# Patient Record
Sex: Female | Born: 1965 | Race: White | Hispanic: No | Marital: Married | Smoking: Former smoker
Health system: Southern US, Community
[De-identification: ages and names within clinical notes are randomized; demographics above are authoritative.]

## PROBLEM LIST (undated history)

## (undated) DIAGNOSIS — E039 Hypothyroidism, unspecified: Secondary | ICD-10-CM

## (undated) DIAGNOSIS — F319 Bipolar disorder, unspecified: Secondary | ICD-10-CM

## (undated) DIAGNOSIS — G4733 Obstructive sleep apnea (adult) (pediatric): Secondary | ICD-10-CM

## (undated) DIAGNOSIS — S82409A Unspecified fracture of shaft of unspecified fibula, initial encounter for closed fracture: Secondary | ICD-10-CM

## (undated) DIAGNOSIS — I10 Essential (primary) hypertension: Secondary | ICD-10-CM

## (undated) DIAGNOSIS — E669 Obesity, unspecified: Secondary | ICD-10-CM

## (undated) DIAGNOSIS — G4700 Insomnia, unspecified: Secondary | ICD-10-CM

## (undated) DIAGNOSIS — G2581 Restless legs syndrome: Secondary | ICD-10-CM

## (undated) DIAGNOSIS — R110 Nausea: Secondary | ICD-10-CM

## (undated) DIAGNOSIS — K219 Gastro-esophageal reflux disease without esophagitis: Secondary | ICD-10-CM

## (undated) DIAGNOSIS — R42 Dizziness and giddiness: Secondary | ICD-10-CM

## (undated) HISTORY — DX: Obstructive sleep apnea (adult) (pediatric): G47.33

## (undated) HISTORY — DX: Gastro-esophageal reflux disease without esophagitis: K21.9

## (undated) HISTORY — DX: Essential (primary) hypertension: I10

## (undated) HISTORY — DX: Unspecified fracture of shaft of unspecified fibula, initial encounter for closed fracture: S82.409A

## (undated) HISTORY — PX: ABDOMINAL HYSTERECTOMY: SHX81

## (undated) HISTORY — DX: Restless legs syndrome: G25.81

## (undated) HISTORY — DX: Insomnia, unspecified: G47.00

## (undated) HISTORY — DX: Nausea: R11.0

## (undated) HISTORY — DX: Hypothyroidism, unspecified: E03.9

## (undated) HISTORY — DX: Obesity, unspecified: E66.9

## (undated) HISTORY — DX: Bipolar disorder, unspecified: F31.9

## (undated) HISTORY — DX: Dizziness and giddiness: R42

---

## 1998-11-29 ENCOUNTER — Ambulatory Visit: Admission: RE | Admit: 1998-11-29 | Discharge: 1998-11-29 | Payer: Self-pay | Admitting: Internal Medicine

## 1999-08-05 ENCOUNTER — Other Ambulatory Visit: Admission: RE | Admit: 1999-08-05 | Discharge: 1999-08-05 | Payer: Self-pay | Admitting: Obstetrics and Gynecology

## 2001-01-28 ENCOUNTER — Ambulatory Visit (HOSPITAL_COMMUNITY): Admission: RE | Admit: 2001-01-28 | Discharge: 2001-01-28 | Payer: Self-pay | Admitting: Obstetrics and Gynecology

## 2001-10-19 ENCOUNTER — Other Ambulatory Visit: Admission: RE | Admit: 2001-10-19 | Discharge: 2001-10-19 | Payer: Self-pay | Admitting: Obstetrics and Gynecology

## 2002-08-19 ENCOUNTER — Encounter: Payer: Self-pay | Admitting: Family Medicine

## 2002-08-19 ENCOUNTER — Encounter: Admission: RE | Admit: 2002-08-19 | Discharge: 2002-08-19 | Payer: Self-pay | Admitting: Family Medicine

## 2002-09-19 ENCOUNTER — Encounter: Admission: RE | Admit: 2002-09-19 | Discharge: 2002-09-19 | Payer: Self-pay | Admitting: Family Medicine

## 2002-09-19 ENCOUNTER — Encounter: Payer: Self-pay | Admitting: Family Medicine

## 2002-11-29 ENCOUNTER — Other Ambulatory Visit: Admission: RE | Admit: 2002-11-29 | Discharge: 2002-11-29 | Payer: Self-pay | Admitting: Obstetrics and Gynecology

## 2003-08-14 ENCOUNTER — Ambulatory Visit (HOSPITAL_COMMUNITY): Admission: RE | Admit: 2003-08-14 | Discharge: 2003-08-14 | Payer: Self-pay | Admitting: Obstetrics and Gynecology

## 2003-12-07 ENCOUNTER — Inpatient Hospital Stay (HOSPITAL_COMMUNITY): Admission: RE | Admit: 2003-12-07 | Discharge: 2003-12-09 | Payer: Self-pay | Admitting: Obstetrics and Gynecology

## 2003-12-20 ENCOUNTER — Inpatient Hospital Stay (HOSPITAL_COMMUNITY): Admission: AD | Admit: 2003-12-20 | Discharge: 2003-12-22 | Payer: Self-pay | Admitting: Obstetrics and Gynecology

## 2004-11-27 ENCOUNTER — Other Ambulatory Visit: Admission: RE | Admit: 2004-11-27 | Discharge: 2004-11-27 | Payer: Self-pay | Admitting: Obstetrics and Gynecology

## 2006-01-30 ENCOUNTER — Other Ambulatory Visit: Admission: RE | Admit: 2006-01-30 | Discharge: 2006-01-30 | Payer: Self-pay | Admitting: Obstetrics and Gynecology

## 2011-10-01 ENCOUNTER — Other Ambulatory Visit (HOSPITAL_COMMUNITY): Payer: Self-pay | Admitting: Family Medicine

## 2011-10-01 DIAGNOSIS — Z1231 Encounter for screening mammogram for malignant neoplasm of breast: Secondary | ICD-10-CM

## 2011-10-31 ENCOUNTER — Ambulatory Visit (HOSPITAL_COMMUNITY)
Admission: RE | Admit: 2011-10-31 | Discharge: 2011-10-31 | Disposition: A | Discharge status: Home / Self Care | Payer: Self-pay | Source: Ambulatory Visit | Attending: Family Medicine | Admitting: Family Medicine

## 2011-10-31 DIAGNOSIS — Z1231 Encounter for screening mammogram for malignant neoplasm of breast: Secondary | ICD-10-CM

## 2013-01-20 ENCOUNTER — Other Ambulatory Visit (HOSPITAL_COMMUNITY): Payer: Self-pay | Admitting: Nurse Practitioner

## 2013-01-31 ENCOUNTER — Ambulatory Visit (HOSPITAL_COMMUNITY)
Admission: RE | Admit: 2013-01-31 | Discharge: 2013-01-31 | Disposition: A | Discharge status: Home / Self Care | Payer: Self-pay | Source: Ambulatory Visit | Attending: Nurse Practitioner | Admitting: Nurse Practitioner

## 2013-01-31 DIAGNOSIS — Z1231 Encounter for screening mammogram for malignant neoplasm of breast: Secondary | ICD-10-CM

## 2014-06-12 ENCOUNTER — Encounter: Payer: Self-pay | Admitting: Internal Medicine

## 2014-07-19 ENCOUNTER — Telehealth: Payer: Self-pay | Admitting: Internal Medicine

## 2014-07-19 NOTE — Telephone Encounter (Signed)
Not sure if she had xrays or any pertinent testing we might need to acquire prior to upcoming appointment w/Dr Juanda Chance on 08-15-14.

## 2014-08-15 ENCOUNTER — Ambulatory Visit: Payer: Self-pay | Admitting: Internal Medicine

## 2014-10-10 ENCOUNTER — Ambulatory Visit: Payer: Self-pay | Admitting: Internal Medicine

## 2014-12-05 ENCOUNTER — Ambulatory Visit: Payer: Self-pay | Admitting: Internal Medicine

## 2017-09-23 ENCOUNTER — Other Ambulatory Visit: Payer: Self-pay | Admitting: Gastroenterology

## 2017-09-23 DIAGNOSIS — R110 Nausea: Secondary | ICD-10-CM

## 2017-09-29 ENCOUNTER — Encounter (HOSPITAL_COMMUNITY): Payer: Self-pay | Admitting: Emergency Medicine

## 2017-09-29 ENCOUNTER — Other Ambulatory Visit: Payer: Self-pay

## 2017-09-29 ENCOUNTER — Ambulatory Visit (HOSPITAL_COMMUNITY)
Admission: EM | Admit: 2017-09-29 | Discharge: 2017-10-01 | Disposition: A | Discharge status: Home / Self Care | Payer: No Typology Code available for payment source | Attending: General Surgery | Admitting: General Surgery

## 2017-09-29 ENCOUNTER — Ambulatory Visit
Admission: RE | Admit: 2017-09-29 | Discharge: 2017-09-29 | Disposition: A | Discharge status: Home / Self Care | Payer: 59 | Source: Ambulatory Visit | Attending: Gastroenterology | Admitting: Gastroenterology

## 2017-09-29 DIAGNOSIS — I10 Essential (primary) hypertension: Secondary | ICD-10-CM | POA: Diagnosis not present

## 2017-09-29 DIAGNOSIS — E039 Hypothyroidism, unspecified: Secondary | ICD-10-CM | POA: Diagnosis not present

## 2017-09-29 DIAGNOSIS — Z419 Encounter for procedure for purposes other than remedying health state, unspecified: Secondary | ICD-10-CM

## 2017-09-29 DIAGNOSIS — K8010 Calculus of gallbladder with chronic cholecystitis without obstruction: Secondary | ICD-10-CM | POA: Diagnosis not present

## 2017-09-29 DIAGNOSIS — R110 Nausea: Secondary | ICD-10-CM

## 2017-09-29 DIAGNOSIS — K219 Gastro-esophageal reflux disease without esophagitis: Secondary | ICD-10-CM | POA: Diagnosis not present

## 2017-09-29 DIAGNOSIS — K812 Acute cholecystitis with chronic cholecystitis: Secondary | ICD-10-CM | POA: Diagnosis present

## 2017-09-29 DIAGNOSIS — K810 Acute cholecystitis: Secondary | ICD-10-CM | POA: Diagnosis present

## 2017-09-29 DIAGNOSIS — E669 Obesity, unspecified: Secondary | ICD-10-CM | POA: Diagnosis not present

## 2017-09-29 LAB — COMPREHENSIVE METABOLIC PANEL
ALBUMIN: 3.7 g/dL (ref 3.5–5.0)
ALT: 19 U/L (ref 14–54)
AST: 22 U/L (ref 15–41)
Alkaline Phosphatase: 61 U/L (ref 38–126)
Anion gap: 6 (ref 5–15)
BUN: 12 mg/dL (ref 6–20)
CALCIUM: 9.9 mg/dL (ref 8.9–10.3)
CO2: 29 mmol/L (ref 22–32)
Chloride: 104 mmol/L (ref 101–111)
Creatinine, Ser: 1 mg/dL (ref 0.44–1.00)
GFR calc Af Amer: >60 mL/min (ref >60)
GFR calc non Af Amer: >60 mL/min (ref >60)
Glucose, Bld: 98 mg/dL (ref 65–99)
POTASSIUM: 4.2 mmol/L (ref 3.5–5.1)
SODIUM: 139 mmol/L (ref 135–145)
TOTAL PROTEIN: 7.3 g/dL (ref 6.5–8.1)
Total Bilirubin: 0.8 mg/dL (ref 0.3–1.2)

## 2017-09-29 LAB — URINALYSIS, ROUTINE W REFLEX MICROSCOPIC
BILIRUBIN URINE: NEGATIVE
Glucose, UA: NEGATIVE mg/dL
Hgb urine dipstick: NEGATIVE
Ketones, ur: NEGATIVE mg/dL
LEUKOCYTES UA: NEGATIVE
NITRITE: NEGATIVE
Protein, ur: NEGATIVE mg/dL
SPECIFIC GRAVITY, URINE: 1.017 (ref 1.005–1.030)
pH: 6 (ref 5.0–8.0)

## 2017-09-29 LAB — CBC
HEMATOCRIT: 40.3 % (ref 36.0–46.0)
HEMOGLOBIN: 13.6 g/dL (ref 12.0–15.0)
MCH: 28.8 pg (ref 26.0–34.0)
MCHC: 33.7 g/dL (ref 30.0–36.0)
MCV: 85.4 fL (ref 78.0–100.0)
Platelets: 244 10*3/uL (ref 150–400)
RBC: 4.72 MIL/uL (ref 3.87–5.11)
RDW: 14.2 % (ref 11.5–15.5)
WBC: 7.8 10*3/uL (ref 4.0–10.5)

## 2017-09-29 LAB — LIPASE, BLOOD: Lipase: 34 U/L (ref 11–51)

## 2017-09-29 MED ORDER — ONDANSETRON HCL 4 MG/2ML IJ SOLN
4.0000 mg | Freq: Once | INTRAMUSCULAR | Status: AC
  Administered 2017-09-29: 4 mg via INTRAVENOUS
  Filled 2017-09-29: qty 2

## 2017-09-29 MED ORDER — HYDROCHLOROTHIAZIDE 12.5 MG PO CAPS: 12.5000 mg | ORAL_CAPSULE | Freq: Every day | ORAL | Status: DC

## 2017-09-29 MED ORDER — OXYCODONE HCL 5 MG PO TABS: 5.0000 mg | ORAL_TABLET | ORAL | Status: DC | PRN

## 2017-09-29 MED ORDER — LEVOTHYROXINE SODIUM 125 MCG PO TABS
125.0000 ug | ORAL_TABLET | Freq: Every day | ORAL | Status: DC
  Administered 2017-09-30: 125 ug via ORAL
  Filled 2017-09-29: qty 1

## 2017-09-29 MED ORDER — ONDANSETRON 4 MG PO TBDP: 4.0000 mg | ORAL_TABLET | Freq: Four times a day (QID) | ORAL | Status: DC | PRN

## 2017-09-29 MED ORDER — METHOCARBAMOL 500 MG PO TABS: 500.0000 mg | ORAL_TABLET | Freq: Four times a day (QID) | ORAL | Status: DC | PRN

## 2017-09-29 MED ORDER — ZOLPIDEM TARTRATE 5 MG PO TABS
5.0000 mg | ORAL_TABLET | Freq: Every evening | ORAL | Status: DC | PRN
  Administered 2017-09-29: 5 mg via ORAL
  Filled 2017-09-29: qty 1

## 2017-09-29 MED ORDER — DIPHENHYDRAMINE HCL 12.5 MG/5ML PO ELIX: 12.5000 mg | ORAL_SOLUTION | Freq: Four times a day (QID) | ORAL | Status: DC | PRN

## 2017-09-29 MED ORDER — DIPHENHYDRAMINE HCL 50 MG/ML IJ SOLN: 12.5000 mg | Freq: Four times a day (QID) | INTRAMUSCULAR | Status: DC | PRN

## 2017-09-29 MED ORDER — ONDANSETRON HCL 4 MG/2ML IJ SOLN
4.0000 mg | Freq: Four times a day (QID) | INTRAMUSCULAR | Status: DC | PRN
  Administered 2017-09-30: 4 mg via INTRAVENOUS
  Filled 2017-09-29: qty 2

## 2017-09-29 MED ORDER — HYDRALAZINE HCL 20 MG/ML IJ SOLN: 10.0000 mg | INTRAMUSCULAR | Status: DC | PRN

## 2017-09-29 MED ORDER — CEFTRIAXONE SODIUM 2 G IJ SOLR
2.0000 g | INTRAMUSCULAR | Status: DC
  Administered 2017-09-30: 3 g via INTRAVENOUS
  Filled 2017-09-29: qty 2

## 2017-09-29 MED ORDER — DEXTROSE 5 % IV SOLN
2.0000 g | Freq: Once | INTRAVENOUS | Status: AC
  Administered 2017-09-29: 2 g via INTRAVENOUS
  Filled 2017-09-29: qty 2

## 2017-09-29 MED ORDER — LORATADINE 10 MG PO TABS: 10.0000 mg | ORAL_TABLET | Freq: Every day | ORAL | Status: DC

## 2017-09-29 MED ORDER — ENOXAPARIN SODIUM 40 MG/0.4ML ~~LOC~~ SOLN: 40.0000 mg | SUBCUTANEOUS | Status: DC

## 2017-09-29 MED ORDER — BENAZEPRIL HCL 20 MG PO TABS
20.0000 mg | ORAL_TABLET | Freq: Every day | ORAL | Status: DC
  Administered 2017-09-29: 20 mg via ORAL
  Filled 2017-09-29 (×2): qty 1

## 2017-09-29 MED ORDER — ACETAMINOPHEN 325 MG PO TABS: 650.0000 mg | ORAL_TABLET | Freq: Four times a day (QID) | ORAL | Status: DC | PRN

## 2017-09-29 MED ORDER — HYDROMORPHONE HCL 1 MG/ML IJ SOLN: 1.0000 mg | INTRAMUSCULAR | Status: DC | PRN

## 2017-09-29 MED ORDER — KCL IN DEXTROSE-NACL 20-5-0.9 MEQ/L-%-% IV SOLN
INTRAVENOUS | Status: DC
  Administered 2017-09-29: 22:00:00 via INTRAVENOUS
  Filled 2017-09-29 (×2): qty 1000

## 2017-09-29 MED ORDER — ACETAMINOPHEN 650 MG RE SUPP: 650.0000 mg | Freq: Four times a day (QID) | RECTAL | Status: DC | PRN

## 2017-09-29 NOTE — ED Notes (Signed)
Attempted report x1. 

## 2017-09-29 NOTE — ED Triage Notes (Signed)
Pt to ER sent from PCP for cholecystectomy. States not much pain but significant nausea. Pt reports having [place] completed this morning and was called to come to ER. Pt a/o x4. In NAD.

## 2017-09-29 NOTE — H&P (Signed)
Wanda Pratt is an 51 y.o. female.   Chief Complaint: Abdominal pain and nausea for 3 months HPI: Patient presents emergency room with a 58-monthhistory of intermittent nausea.  The nausea is been severe and is made worse with eating.  She is now developed right upper quadrant pain.  Ultrasound was done which shows multiple gallstones and mildly thickened gallbladder wall.  She has right upper quadrant pain especially with palpation.  She has not had this pain before but the nausea and symptoms have been persistent for at least 3 years.  Past Medical History:  Diagnosis Date  . GERD (gastroesophageal reflux disease)   . Hypertension   . Hypothyroidism   . Obesity     History reviewed. No pertinent surgical history.  History reviewed. No pertinent family history. Social History:  reports that  has never smoked. she has never used smokeless tobacco. She reports that she does not drink alcohol or use drugs.  Allergies: Not on File   (Not in a hospital admission)  Results for orders placed or performed during the hospital encounter of 09/29/17 (from the past 48 hour(s))  Lipase, blood     Status: None   Collection Time: 09/29/17  1:12 PM  Result Value Ref Range   Lipase 34 11 - 51 U/L  Comprehensive metabolic panel     Status: None   Collection Time: 09/29/17  1:12 PM  Result Value Ref Range   Sodium 139 135 - 145 mmol/L   Potassium 4.2 3.5 - 5.1 mmol/L   Chloride 104 101 - 111 mmol/L   CO2 29 22 - 32 mmol/L   Glucose, Bld 98 65 - 99 mg/dL   BUN 12 6 - 20 mg/dL   Creatinine, Ser 1.00 0.44 - 1.00 mg/dL   Calcium 9.9 8.9 - 10.3 mg/dL   Total Protein 7.3 6.5 - 8.1 g/dL   Albumin 3.7 3.5 - 5.0 g/dL   AST 22 15 - 41 U/L   ALT 19 14 - 54 U/L   Alkaline Phosphatase 61 38 - 126 U/L   Total Bilirubin 0.8 0.3 - 1.2 mg/dL   GFR calc non Af Amer >60 >60 mL/min   GFR calc Af Amer >60 >60 mL/min    Comment: (NOTE) The eGFR has been calculated using the CKD EPI equation. This  calculation has not been validated in all clinical situations. eGFR's persistently <60 mL/min signify possible Chronic Kidney Disease.    Anion gap 6 5 - 15  CBC     Status: None   Collection Time: 09/29/17  1:12 PM  Result Value Ref Range   WBC 7.8 4.0 - 10.5 K/uL   RBC 4.72 3.87 - 5.11 MIL/uL   Hemoglobin 13.6 12.0 - 15.0 g/dL   HCT 40.3 36.0 - 46.0 %   MCV 85.4 78.0 - 100.0 fL   MCH 28.8 26.0 - 34.0 pg   MCHC 33.7 30.0 - 36.0 g/dL   RDW 14.2 11.5 - 15.5 %   Platelets 244 150 - 400 K/uL  Urinalysis, Routine w reflex microscopic     Status: None   Collection Time: 09/29/17  1:27 PM  Result Value Ref Range   Color, Urine YELLOW YELLOW   APPearance CLEAR CLEAR   Specific Gravity, Urine 1.017 1.005 - 1.030   pH 6.0 5.0 - 8.0   Glucose, UA NEGATIVE NEGATIVE mg/dL   Hgb urine dipstick NEGATIVE NEGATIVE   Bilirubin Urine NEGATIVE NEGATIVE   Ketones, ur NEGATIVE NEGATIVE mg/dL  Protein, ur NEGATIVE NEGATIVE mg/dL   Nitrite NEGATIVE NEGATIVE   Leukocytes, UA NEGATIVE NEGATIVE   US Abdomen Complete  Result Date: 09/29/2017 CLINICAL DATA:  Nausea, history of gastroesophageal reflux. EXAM: ABDOMEN ULTRASOUND COMPLETE COMPARISON:  None in PACs FINDINGS: Gallbladder: The gallbladder is filled with stones. The largest measures 1.1 cm. There is mild gallbladder wall thickening to 3.8 mm. There is a positive sonographic Murphy's sign. Common bile duct: Diameter: 5 mm Liver: Penetration of the liver by the ultrasound beam was limited. The echotexture appears increased. There is no discrete mass or ductal dilation. The surface contour appears smooth. Portal vein is patent on color Doppler imaging with normal direction of blood flow towards the liver. IVC: No abnormality visualized. Pancreas: Visualized portion unremarkable. Spleen: 10.3 cm in length with normal echotexture Right Kidney: Length: 11.7 cm. Echogenicity within normal limits. No mass or hydronephrosis visualized. Left Kidney: Length:  13.4 cm. Echogenicity within normal limits. No mass or hydronephrosis visualized. Abdominal aorta: Visualization is limited. No aneurysm was observed. Other findings: No ascites is demonstrated. IMPRESSION: Gallstones with sonographic evidence of acute cholecystitis. Increased hepatic echotexture which limits evaluation of the liver. No discrete hepatic mass or ductal dilation. No acute abnormality observed elsewhere within the abdomen. These results will be called to the ordering clinician or representative by the Radiologist Assistant, and communication documented in the PACS or zVision Dashboard. Electronically Signed   By: David  Martin[place] M.D.   On: 09/29/2017 09:51    Review of Systems  Constitutional: Negative for chills and fever.  HENT: Negative for hearing loss and tinnitus.   Eyes: Negative for blurred vision and double vision.  Respiratory: Negative for cough and hemoptysis.   Cardiovascular: Negative for chest pain and palpitations.  Gastrointestinal: Positive for abdominal pain, nausea and vomiting.  Genitourinary: Negative for dysuria.  Musculoskeletal: Negative for myalgias.  Skin: Negative for rash.  Neurological: Negative for dizziness.  Endo/Heme/Allergies: Does not bruise/bleed easily.  Psychiatric/Behavioral: Negative for depression.    Blood pressure 117/81, pulse 67, temperature 98.9 F (37.2 C), temperature source Oral, resp. rate 18, height 5' 5.5" (1.664 m), weight 132.5 kg (292 lb), SpO2 99 %. Physical Exam  Constitutional: She is oriented to person, place, and time. She appears well-developed and well-nourished.  HENT:  Head: Normocephalic.  Eyes: Pupils are equal, round, and reactive to light.  Neck: Normal range of motion.  Cardiovascular: Normal rate and regular rhythm.  Respiratory: Effort normal and breath sounds normal.  GI: Soft. There is tenderness. There is positive Murphy's sign.  Musculoskeletal: Normal range of motion.  Neurological: She is alert and  oriented to person, place, and time.  Skin: Skin is warm and dry.     Assessment/Plan Cholelithiasis with early acute cholecystitis  Admit for IV fluids, IV antibiotics.  Will benefit from cholecystectomy and will try to schedule for tomorrow morning.  Dr. Dalbert Batman will see to discuss further in the morning.  The patient's questions were answered along with her husband.  Turner Daniels, MD 09/29/2017, 5:34 PM

## 2017-09-29 NOTE — ED Notes (Signed)
Pt recently ate breakfast at 9 am.

## 2017-09-29 NOTE — ED Notes (Signed)
Pt given ginger ale, tolerating well. Family at bedside.

## 2017-09-29 NOTE — ED Notes (Signed)
Pt provided with chicken broth 

## 2017-09-29 NOTE — ED Provider Notes (Signed)
[facility] EMERGENCY DEPARTMENT Provider Note   CSN: [id number] Arrival date & time: 09/29/17  1204     History   Chief Complaint Chief Complaint  Patient presents with  . Abdominal Pain    HPI Wanda Pratt is a 51 y.o. female who presents with nausea and abdominal pain. PMH significant of GERD, HTN, hypothyroidism. She is s/p hysterectomy and has had C-sections. She states that she has had nausea and intermittent abdominal pain for the past 2 years. She states she didn't have insurance so never got this checked. She recently obtained insurance so went to [facility] and had an US done. The US shows multiple gallstones and evidence of cholecystitis. The patient reports chills and constant nausea which she takes Phenergan and Zofran for. She also has abdominal pain but this is not as bad as the nausea. The pain is sometimes worse after eating. She has been NPO since 9AM this morning. No fever, chest pain, SOB, diarrhea/constipation, urinary symptoms.  HPI  Past Medical History:  Diagnosis Date  . GERD (gastroesophageal reflux disease)   . Hypertension   . Hypothyroidism   . Obesity     There are no active problems to display for this patient.   History reviewed. No pertinent surgical history.  OB History    No data available       Home Medications    Prior to Admission medications   Medication Sig Start Date End Date Taking? Authorizing Provider  benazepril (LOTENSIN) 20 MG tablet Take 20 mg by mouth daily.    [provider]  hydrochlorothiazide (MICROZIDE) 12.5 MG capsule Take 12.5 mg by mouth daily.    [provider]  levothyroxine (SYNTHROID, LEVOTHROID) 125 MCG tablet Take 125 mcg by mouth daily before breakfast.    [provider]  loratadine (CLARITIN) 10 MG tablet Take 10 mg by mouth daily.    [provider]  ondansetron (ZOFRAN) 4 MG tablet Take 4 mg by mouth 2 (two) times daily.    [provider]  ranitidine (ZANTAC) 150 MG capsule Take 150 mg by mouth 2 (two) times daily.    [provider]    Family History History reviewed. No pertinent family history.  Social History Social History   Tobacco Use  . Smoking status: Never Smoker  . Smokeless tobacco: Never Used  Substance Use Topics  . Alcohol use: No    Frequency: Never  . Drug use: No     Allergies   Patient has no allergy information on record.   Review of Systems Review of Systems  Constitutional: Positive for chills. Negative for appetite change and fever.  Respiratory: Negative for shortness of breath.   Cardiovascular: Negative for chest pain.  Gastrointestinal: Positive for abdominal pain and nausea. Negative for constipation, diarrhea and vomiting.  Genitourinary: Negative for dysuria, frequency and pelvic pain.  All other systems reviewed and are negative.    Physical Exam Updated Vital Signs BP 117/81 (BP Location: Left Arm)   Pulse 67   Temp 98.9 F (37.2 C) (Oral)   Resp 18   Ht 5' 5.5" (1.664 m)   Wt 132.5 kg (292 lb)   SpO2 99%   BMI 47.85 kg/m   Physical Exam  Constitutional: She is oriented to person, place, and time. She appears well-developed and well-nourished. No distress.  Obese, calm, pleasant  HENT:  Head: Normocephalic and atraumatic.  Eyes: Conjunctivae are normal. Pupils are equal, round, and reactive to  light. Right eye exhibits no discharge. Left eye exhibits no discharge. No scleral icterus.  Neck: Normal range of motion.  Cardiovascular: Normal rate and regular rhythm. Exam reveals no gallop and no friction rub.  No murmur heard. Pulmonary/Chest: Effort normal and breath sounds normal. No stridor. No respiratory distress. She has no wheezes. She has no rales. She exhibits no tenderness.  Abdominal: Soft. Bowel sounds are normal. She exhibits no distension and no mass. There is tenderness (Significant RUQ tenderness. Mild RLQ tenderness). There  is no rebound and no guarding. No hernia.  Exam difficult due to body habitus  Neurological: She is alert and oriented to person, place, and time.  Skin: Skin is warm and dry.  Psychiatric: She has a normal mood and affect. Her behavior is normal.  Nursing note and vitals reviewed.    ED Treatments / Results  Labs (all labs ordered are listed, but only abnormal results are displayed) Labs Reviewed  LIPASE, BLOOD  COMPREHENSIVE METABOLIC PANEL  CBC  URINALYSIS, ROUTINE W REFLEX MICROSCOPIC    EKG  EKG Interpretation None       Radiology [place] Abdomen Complete  Result Date: 09/29/2017 CLINICAL DATA:  Nausea, history of gastroesophageal reflux. EXAM: ABDOMEN ULTRASOUND COMPLETE COMPARISON:  None in PACs FINDINGS: Gallbladder: The gallbladder is filled with stones. The largest measures 1.1 cm. There is mild gallbladder wall thickening to 3.8 mm. There is a positive sonographic Murphy's sign. Common bile duct: Diameter: 5 mm Liver: Penetration of the liver by the ultrasound beam was limited. The echotexture appears increased. There is no discrete mass or ductal dilation. The surface contour appears smooth. Portal vein is patent on color Doppler imaging with normal direction of blood flow towards the liver. IVC: No abnormality visualized. Pancreas: Visualized portion unremarkable. Spleen: 10.3 cm in length with normal echotexture Right Kidney: Length: 11.7 cm. Echogenicity within normal limits. No mass or hydronephrosis visualized. Left Kidney: Length: 13.4 cm. Echogenicity within normal limits. No mass or hydronephrosis visualized. Abdominal aorta: Visualization is limited. No aneurysm was observed. Other findings: No ascites is demonstrated. IMPRESSION: Gallstones with sonographic evidence of acute cholecystitis. Increased hepatic echotexture which limits evaluation of the liver. No discrete hepatic mass or ductal dilation. No acute abnormality observed elsewhere within the abdomen. These  results will be called to the ordering clinician or representative by the Radiologist Assistant, and communication documented in the PACS or zVision Dashboard. Electronically Signed   By: David  [place] M.D.   On: 09/29/2017 09:51    Procedures Procedures (including critical care time)  Medications Ordered in ED Medications  cefTRIAXone (ROCEPHIN) 2 g in dextrose 5 % 50 mL IVPB (2 g Intravenous New Bag/Given 09/29/17 1735)  benazepril (LOTENSIN) tablet 20 mg (not administered)  hydrochlorothiazide (MICROZIDE) capsule 12.5 mg (not administered)  levothyroxine (SYNTHROID, LEVOTHROID) tablet 125 mcg (not administered)  loratadine (CLARITIN) tablet 10 mg (not administered)  ondansetron (ZOFRAN) injection 4 mg (4 mg Intravenous Given 09/29/17 1735)     Initial Impression / Assessment and Plan / ED Course  I have reviewed the triage vital signs and the nursing notes.  Pertinent labs & imaging results that were available during my care of the patient were reviewed by me and considered in my medical decision making (see chart for details).  51 year old female presents with abdominal pain and nausea and evidence of acute cholecystitis. Vitals are normal. She is well appearing. She is tender in RUQ on exam. Labs are normal. Ua is clean. US from earlier  today shows cholecystitis. Will start Rocephin and Zofran. She declines pain medicine. I spoke with Dr. Luisa Hart with surgery who will come to see patient.  Final Clinical Impressions(s) / ED Diagnoses   Final diagnoses:  Acute cholecystitis    ED Discharge Orders    None       Bethel Born, PA-C 09/29/17 2012    Nira Conn, MD 09/30/17 [phone]

## 2017-09-30 ENCOUNTER — Ambulatory Visit (HOSPITAL_COMMUNITY)
Admission: EM | Disposition: A | Discharge status: Home / Self Care | Payer: Self-pay | Source: Home / Self Care | Attending: Emergency Medicine

## 2017-09-30 ENCOUNTER — Inpatient Hospital Stay (HOSPITAL_COMMUNITY): Payer: No Typology Code available for payment source

## 2017-09-30 ENCOUNTER — Inpatient Hospital Stay (HOSPITAL_COMMUNITY): Payer: No Typology Code available for payment source | Admitting: Critical Care Medicine

## 2017-09-30 ENCOUNTER — Encounter (HOSPITAL_COMMUNITY): Payer: Self-pay | Admitting: Critical Care Medicine

## 2017-09-30 DIAGNOSIS — K812 Acute cholecystitis with chronic cholecystitis: Secondary | ICD-10-CM | POA: Diagnosis present

## 2017-09-30 HISTORY — PX: CHOLECYSTECTOMY: SHX55

## 2017-09-30 LAB — CBC
HCT: 40.5 % (ref 36.0–46.0)
HEMOGLOBIN: 13.3 g/dL (ref 12.0–15.0)
MCH: 28.6 pg (ref 26.0–34.0)
MCHC: 32.8 g/dL (ref 30.0–36.0)
MCV: 87.1 fL (ref 78.0–100.0)
PLATELETS: 236 10*3/uL (ref 150–400)
RBC: 4.65 MIL/uL (ref 3.87–5.11)
RDW: 14.6 % (ref 11.5–15.5)
WBC: 6.2 10*3/uL (ref 4.0–10.5)

## 2017-09-30 LAB — CREATININE, SERUM: CREATININE: 0.99 mg/dL (ref 0.44–1.00)

## 2017-09-30 LAB — HIV ANTIBODY (ROUTINE TESTING W REFLEX): HIV SCREEN 4TH GENERATION: NONREACTIVE

## 2017-09-30 LAB — SURGICAL PCR SCREEN
MRSA, PCR: NEGATIVE
STAPHYLOCOCCUS AUREUS: NEGATIVE

## 2017-09-30 SURGERY — LAPAROSCOPIC CHOLECYSTECTOMY WITH INTRAOPERATIVE CHOLANGIOGRAM
Anesthesia: General | Site: Abdomen

## 2017-09-30 MED ORDER — CEFAZOLIN SODIUM 1 G IJ SOLR
INTRAMUSCULAR | Status: AC
  Filled 2017-09-30: qty 30

## 2017-09-30 MED ORDER — HYDROCODONE-ACETAMINOPHEN 5-325 MG PO TABS
1.0000 | ORAL_TABLET | ORAL | Status: DC | PRN
  Administered 2017-09-30 – 2017-10-01 (×4): 2 via ORAL
  Filled 2017-09-30 (×4): qty 2

## 2017-09-30 MED ORDER — ENOXAPARIN SODIUM 40 MG/0.4ML ~~LOC~~ SOLN
40.0000 mg | SUBCUTANEOUS | Status: DC
  Administered 2017-10-01: 40 mg via SUBCUTANEOUS
  Filled 2017-09-30: qty 0.4

## 2017-09-30 MED ORDER — SUCCINYLCHOLINE CHLORIDE 200 MG/10ML IV SOSY
PREFILLED_SYRINGE | INTRAVENOUS | Status: DC | PRN
  Administered 2017-09-30: 100 mg via INTRAVENOUS

## 2017-09-30 MED ORDER — FENTANYL CITRATE (PF) 100 MCG/2ML IJ SOLN
25.0000 ug | INTRAMUSCULAR | Status: DC | PRN
  Administered 2017-09-30 (×2): 50 ug via INTRAVENOUS

## 2017-09-30 MED ORDER — ONDANSETRON HCL 4 MG/2ML IJ SOLN
INTRAMUSCULAR | Status: DC | PRN
  Administered 2017-09-30: 4 mg via INTRAVENOUS

## 2017-09-30 MED ORDER — LACTATED RINGERS IV SOLN
INTRAVENOUS | Status: DC
  Administered 2017-09-30 – 2017-10-01 (×2): via INTRAVENOUS

## 2017-09-30 MED ORDER — METHOCARBAMOL 500 MG PO TABS
ORAL_TABLET | ORAL | Status: AC
  Administered 2017-09-30: 500 mg via ORAL
  Filled 2017-09-30: qty 1

## 2017-09-30 MED ORDER — LIDOCAINE 2% (20 MG/ML) 5 ML SYRINGE
INTRAMUSCULAR | Status: AC
  Filled 2017-09-30: qty 5

## 2017-09-30 MED ORDER — DEXAMETHASONE SODIUM PHOSPHATE 10 MG/ML IJ SOLN
INTRAMUSCULAR | Status: DC | PRN
  Administered 2017-09-30: 10 mg via INTRAVENOUS

## 2017-09-30 MED ORDER — LIDOCAINE 2% (20 MG/ML) 5 ML SYRINGE
INTRAMUSCULAR | Status: DC | PRN
  Administered 2017-09-30: 100 mg via INTRAVENOUS

## 2017-09-30 MED ORDER — DEXAMETHASONE SODIUM PHOSPHATE 10 MG/ML IJ SOLN
INTRAMUSCULAR | Status: AC
  Filled 2017-09-30: qty 1

## 2017-09-30 MED ORDER — LACTATED RINGERS IV SOLN
INTRAVENOUS | Status: DC
  Administered 2017-09-30: 12:00:00 via INTRAVENOUS
  Administered 2017-09-30: 50 mL/h via INTRAVENOUS

## 2017-09-30 MED ORDER — FENTANYL CITRATE (PF) 100 MCG/2ML IJ SOLN
INTRAMUSCULAR | Status: AC
  Administered 2017-09-30: 50 ug via INTRAVENOUS
  Filled 2017-09-30: qty 2

## 2017-09-30 MED ORDER — MIDAZOLAM HCL 2 MG/2ML IJ SOLN
INTRAMUSCULAR | Status: AC
  Filled 2017-09-30: qty 2

## 2017-09-30 MED ORDER — BUPIVACAINE-EPINEPHRINE 0.5% -1:200000 IJ SOLN
INTRAMUSCULAR | Status: DC | PRN
  Administered 2017-09-30: 24 mL

## 2017-09-30 MED ORDER — FENTANYL CITRATE (PF) 250 MCG/5ML IJ SOLN
INTRAMUSCULAR | Status: AC
  Filled 2017-09-30: qty 5

## 2017-09-30 MED ORDER — FENTANYL CITRATE (PF) 250 MCG/5ML IJ SOLN
INTRAMUSCULAR | Status: DC | PRN
  Administered 2017-09-30: 50 ug via INTRAVENOUS
  Administered 2017-09-30 (×4): 25 ug via INTRAVENOUS
  Administered 2017-09-30 (×2): 50 ug via INTRAVENOUS
  Administered 2017-09-30: 100 ug via INTRAVENOUS
  Administered 2017-09-30: 25 ug via INTRAVENOUS

## 2017-09-30 MED ORDER — SUCCINYLCHOLINE CHLORIDE 200 MG/10ML IV SOSY
PREFILLED_SYRINGE | INTRAVENOUS | Status: AC
  Filled 2017-09-30: qty 10

## 2017-09-30 MED ORDER — ONDANSETRON HCL 4 MG/2ML IJ SOLN
INTRAMUSCULAR | Status: AC
  Filled 2017-09-30: qty 2

## 2017-09-30 MED ORDER — IOPAMIDOL (ISOVUE-300) INJECTION 61%
INTRAVENOUS | Status: AC
  Filled 2017-09-30: qty 50

## 2017-09-30 MED ORDER — PROPOFOL 10 MG/ML IV BOLUS
INTRAVENOUS | Status: AC
  Filled 2017-09-30: qty 20

## 2017-09-30 MED ORDER — THYROID 120 MG PO TABS
120.0000 mg | ORAL_TABLET | Freq: Every day | ORAL | Status: DC
  Administered 2017-10-01: 120 mg via ORAL
  Filled 2017-09-30: qty 1

## 2017-09-30 MED ORDER — BUPIVACAINE-EPINEPHRINE (PF) 0.5% -1:200000 IJ SOLN
INTRAMUSCULAR | Status: AC
  Filled 2017-09-30: qty 30

## 2017-09-30 MED ORDER — ONDANSETRON 4 MG PO TBDP: 4.0000 mg | ORAL_TABLET | Freq: Four times a day (QID) | ORAL | Status: DC | PRN

## 2017-09-30 MED ORDER — HYDROCODONE-ACETAMINOPHEN 7.5-325 MG PO TABS: 1.0000 | ORAL_TABLET | Freq: Four times a day (QID) | ORAL | Status: DC | PRN

## 2017-09-30 MED ORDER — SUGAMMADEX SODIUM 500 MG/5ML IV SOLN
INTRAVENOUS | Status: AC
  Filled 2017-09-30: qty 5

## 2017-09-30 MED ORDER — PROPOFOL 10 MG/ML IV BOLUS
INTRAVENOUS | Status: DC | PRN
  Administered 2017-09-30: 20 mg via INTRAVENOUS
  Administered 2017-09-30: 200 mg via INTRAVENOUS
  Administered 2017-09-30: 10 mg via INTRAVENOUS

## 2017-09-30 MED ORDER — 0.9 % SODIUM CHLORIDE (POUR BTL) OPTIME
TOPICAL | Status: DC | PRN
  Administered 2017-09-30: 1000 mL

## 2017-09-30 MED ORDER — TRAZODONE HCL 100 MG PO TABS
200.0000 mg | ORAL_TABLET | Freq: Every day | ORAL | Status: DC
  Administered 2017-09-30: 200 mg via ORAL
  Filled 2017-09-30: qty 2

## 2017-09-30 MED ORDER — SUGAMMADEX SODIUM 200 MG/2ML IV SOLN
INTRAVENOUS | Status: DC | PRN
  Administered 2017-09-30: 270 mg via INTRAVENOUS

## 2017-09-30 MED ORDER — LITHIUM CARBONATE 300 MG PO CAPS
300.0000 mg | ORAL_CAPSULE | Freq: Two times a day (BID) | ORAL | Status: DC
  Administered 2017-09-30 – 2017-10-01 (×2): 300 mg via ORAL
  Filled 2017-09-30 (×3): qty 1

## 2017-09-30 MED ORDER — SODIUM CHLORIDE 0.9 % IV SOLN
INTRAVENOUS | Status: DC | PRN
  Administered 2017-09-30: 12:00:00

## 2017-09-30 MED ORDER — GABAPENTIN 300 MG PO CAPS
300.0000 mg | ORAL_CAPSULE | Freq: Every day | ORAL | Status: DC
  Administered 2017-09-30: 300 mg via ORAL
  Filled 2017-09-30: qty 1

## 2017-09-30 MED ORDER — CEFTRIAXONE SODIUM 2 G IJ SOLR
2.0000 g | INTRAMUSCULAR | Status: DC
  Filled 2017-09-30: qty 2

## 2017-09-30 MED ORDER — MIDAZOLAM HCL 5 MG/5ML IJ SOLN
INTRAMUSCULAR | Status: DC | PRN
  Administered 2017-09-30: 2 mg via INTRAVENOUS

## 2017-09-30 MED ORDER — ROCURONIUM BROMIDE 10 MG/ML (PF) SYRINGE
PREFILLED_SYRINGE | INTRAVENOUS | Status: DC | PRN
  Administered 2017-09-30: 40 mg via INTRAVENOUS
  Administered 2017-09-30: 20 mg via INTRAVENOUS
  Administered 2017-09-30 (×2): 10 mg via INTRAVENOUS

## 2017-09-30 MED ORDER — HYDROMORPHONE HCL 1 MG/ML IJ SOLN
1.0000 mg | INTRAMUSCULAR | Status: DC | PRN
  Administered 2017-09-30 (×2): 1 mg via INTRAVENOUS
  Filled 2017-09-30 (×2): qty 1

## 2017-09-30 MED ORDER — METHOCARBAMOL 500 MG PO TABS
500.0000 mg | ORAL_TABLET | Freq: Four times a day (QID) | ORAL | Status: DC | PRN
  Administered 2017-09-30: 500 mg via ORAL

## 2017-09-30 MED ORDER — HYDROCODONE-ACETAMINOPHEN 5-325 MG PO TABS
ORAL_TABLET | ORAL | Status: AC
  Administered 2017-09-30: 2 via ORAL
  Filled 2017-09-30: qty 2

## 2017-09-30 MED ORDER — ONDANSETRON HCL 4 MG/2ML IJ SOLN
4.0000 mg | Freq: Four times a day (QID) | INTRAMUSCULAR | Status: DC | PRN
  Administered 2017-09-30: 4 mg via INTRAVENOUS
  Filled 2017-09-30: qty 2

## 2017-09-30 SURGICAL SUPPLY — 41 items
ADH SKN CLS APL DERMABOND .7 (GAUZE/BANDAGES/DRESSINGS) ×1
APPLIER CLIP ROT 10 11.4 M/L (STAPLE) ×3
APR CLP MED LRG 11.4X10 (STAPLE) ×1
BAG SPEC RTRVL LRG 6X4 10 (ENDOMECHANICALS) ×1
BLADE CLIPPER SURG (BLADE) IMPLANT
CANISTER SUCT 3000ML PPV (MISCELLANEOUS) ×3 IMPLANT
CHLORAPREP W/TINT 26ML (MISCELLANEOUS) ×3 IMPLANT
CLIP APPLIE ROT 10 11.4 M/L (STAPLE) ×1 IMPLANT
COVER MAYO STAND STRL (DRAPES) ×3 IMPLANT
COVER SURGICAL LIGHT HANDLE (MISCELLANEOUS) ×3 IMPLANT
DERMABOND ADVANCED (GAUZE/BANDAGES/DRESSINGS) ×2
DERMABOND ADVANCED .7 DNX12 (GAUZE/BANDAGES/DRESSINGS) ×1 IMPLANT
DRAPE C-ARM 42X72 X-RAY (DRAPES) ×3 IMPLANT
ELECT REM PT RETURN 9FT ADLT (ELECTROSURGICAL) ×3
ELECTRODE REM PT RTRN 9FT ADLT (ELECTROSURGICAL) ×1 IMPLANT
GLOVE EUDERMIC 7 POWDERFREE (GLOVE) ×3 IMPLANT
GLOVE INDICATOR 7.0 STRL GRN (GLOVE) ×3 IMPLANT
GLOVE SURG SS PI 7.0 STRL IVOR (GLOVE) ×3 IMPLANT
GOWN STRL REUS W/ TWL LRG LVL3 (GOWN DISPOSABLE) ×2 IMPLANT
GOWN STRL REUS W/ TWL XL LVL3 (GOWN DISPOSABLE) ×1 IMPLANT
GOWN STRL REUS W/TWL LRG LVL3 (GOWN DISPOSABLE) ×6
GOWN STRL REUS W/TWL XL LVL3 (GOWN DISPOSABLE) ×3
HEMOSTAT SNOW SURGICEL 2X4 (HEMOSTASIS) ×2 IMPLANT
KIT BASIN OR (CUSTOM PROCEDURE TRAY) ×3 IMPLANT
KIT ROOM TURNOVER OR (KITS) ×3 IMPLANT
NS IRRIG 1000ML POUR BTL (IV SOLUTION) ×3 IMPLANT
PAD ARMBOARD 7.5X6 YLW CONV (MISCELLANEOUS) ×3 IMPLANT
POUCH SPECIMEN RETRIEVAL 10MM (ENDOMECHANICALS) ×3 IMPLANT
SCISSORS LAP 5X35 DISP (ENDOMECHANICALS) ×3 IMPLANT
SET CHOLANGIOGRAPH 5 50 .035 (SET/KITS/TRAYS/PACK) ×3 IMPLANT
SET IRRIG TUBING LAPAROSCOPIC (IRRIGATION / IRRIGATOR) ×3 IMPLANT
SLEEVE ENDOPATH XCEL 5M (ENDOMECHANICALS) ×3 IMPLANT
SPECIMEN JAR SMALL (MISCELLANEOUS) ×3 IMPLANT
SUT MNCRL AB 4-0 PS2 18 (SUTURE) ×3 IMPLANT
TOWEL OR 17X24 6PK STRL BLUE (TOWEL DISPOSABLE) ×3 IMPLANT
TOWEL OR 17X26 10 PK STRL BLUE (TOWEL DISPOSABLE) ×3 IMPLANT
TRAY LAPAROSCOPIC MC (CUSTOM PROCEDURE TRAY) ×3 IMPLANT
TROCAR XCEL BLUNT TIP 100MML (ENDOMECHANICALS) ×3 IMPLANT
TROCAR XCEL NON-BLD 11X100MML (ENDOMECHANICALS) ×3 IMPLANT
TROCAR XCEL NON-BLD 5MMX100MML (ENDOMECHANICALS) ×3 IMPLANT
TUBING INSUFFLATION (TUBING) ×3 IMPLANT

## 2017-09-30 NOTE — Progress Notes (Signed)
Subjective: Stable and alert.  Pain has resolved.  Denies nausea She states she would like to go ahead with cholecystectomy today Ultrasound shows gallbladder filled with stones.  Mild gallbladder wall thickening. CBC, seem at, lipase, urinalysis normal  Objective: Vital signs in last 24 hours: Temp:  [97.5 F (36.4 C)-98.9 F (37.2 C)] 97.5 F (36.4 C) (11/21 0540) Pulse Rate:  [63-72] 63 (11/21 0540) Resp:  [15-22] 18 (11/21 0540) BP: (105-123)/(54-81) 117/67 (11/21 0540) SpO2:  [98 %-100 %] 98 % (11/21 0540) Weight:  [132.5 kg (292 lb)] 132.5 kg (292 lb) (11/20 2021) Last BM Date: 09/29/17  Intake/Output from previous day: 11/20 0701 - 11/21 0700 In: 500 [I.V.:450; IV Piggyback:50] Out: 1 [Urine:1] Intake/Output this shift: Total I/O In: 450 [I.V.:450] Out: 1 [Urine:1]  General appearance: Alert.  Morbidly obese.  Cooperative.  Mental status normal.  No distress Resp: clear to auscultation bilaterally GI: Obese.  Soft and nontender.  No mass or hernia.  Well-healed Pfannenstiel incision  Lab Results:  Recent Labs    09/29/17 1312  WBC 7.8  HGB 13.6  HCT 40.3  PLT 244   BMET Recent Labs    09/29/17 1312  NA 139  K 4.2  CL 104  CO2 29  GLUCOSE 98  BUN 12  CREATININE 1.00  CALCIUM 9.9   PT/INR No results for input(s): LABPROT, INR in the last 72 hours. ABG No results for input(s): PHART, HCO3 in the last 72 hours.  Invalid input(s): PCO2, PO2  Studies/Results: US Abdomen Complete  Result Date: 09/29/2017 CLINICAL DATA:  Nausea, history of gastroesophageal reflux. EXAM: ABDOMEN ULTRASOUND COMPLETE COMPARISON:  None in PACs FINDINGS: Gallbladder: The gallbladder is filled with stones. The largest measures 1.1 cm. There is mild gallbladder wall thickening to 3.8 mm. There is a positive sonographic Murphy's sign. Common bile duct: Diameter: 5 mm Liver: Penetration of the liver by the ultrasound beam was limited. The echotexture appears increased. There  is no discrete mass or ductal dilation. The surface contour appears smooth. Portal vein is patent on color Doppler imaging with normal direction of blood flow towards the liver. IVC: No abnormality visualized. Pancreas: Visualized portion unremarkable. Spleen: 10.3 cm in length with normal echotexture Right Kidney: Length: 11.7 cm. Echogenicity within normal limits. No mass or hydronephrosis visualized. Left Kidney: Length: 13.4 cm. Echogenicity within normal limits. No mass or hydronephrosis visualized. Abdominal aorta: Visualization is limited. No aneurysm was observed. Other findings: No ascites is demonstrated. IMPRESSION: Gallstones with sonographic evidence of acute cholecystitis. Increased hepatic echotexture which limits evaluation of the liver. No discrete hepatic mass or ductal dilation. No acute abnormality observed elsewhere within the abdomen. These results will be called to the ordering clinician or representative by the Radiologist Assistant, and communication documented in the PACS or zVision Dashboard. Electronically Signed   By: David  [place] M.D.   On: 09/29/2017 09:51    Anti-infectives: Anti-infectives (From admission, onward)   Start     Dose/Rate Route Frequency Ordered Stop   09/30/17 1700  cefTRIAXone (ROCEPHIN) 2 g in dextrose 5 % 50 mL IVPB     2 g 100 mL/hr over 30 Minutes Intravenous Every 24 hours 09/29/17 2026     09/29/17 1700  cefTRIAXone (ROCEPHIN) 2 g in dextrose 5 % 50 mL IVPB     2 g 100 mL/hr over 30 Minutes Intravenous  Once 09/29/17 1654 09/29/17 1818      Assessment/Plan: s/p Procedure(s): LAPAROSCOPIC CHOLECYSTECTOMY WITH INTRAOPERATIVE CHOLANGIOGRAM  Chronic cholecystitis with  cholelithiasis.  Possible superimposed acute cholecystitis Proceed with laparoscopic cholecystectomy with possible cholangiogram today I discussed the indications, details, techniques, and numerous risk of the surgery with her.  She's aware the risk of bleeding, infection,  conversion to open laparotomy, port site hernia, bile leak, injury to adjacent organs with major reconstructive surgery.  Postop diarrhea.  Pancreatitis.  She understands all of these issues.  All of her questions are answered.  She agrees with this plan.  Hypertension Hypothyroidism Bipolar disorder Morbid obesity History abdominal hysterectomy   LOS: 1 day    Wanda Pratt 09/30/2017

## 2017-09-30 NOTE — Discharge Instructions (Signed)
Please arrive at least 30 min before your appointment to complete your check in paperwork.  If you are unable to arrive 30 min prior to your appointment time we may have to cancel or reschedule you. ° °LAPAROSCOPIC SURGERY: POST OP INSTRUCTIONS  °1. DIET: Follow a light bland diet the first 24 hours after arrival home, such as soup, liquids, crackers, etc. Be sure to include lots of fluids daily. Avoid fast food or heavy meals as your are more likely to get nauseated. Eat a low fat the next few days after surgery.  °2. Take your usually prescribed home medications unless otherwise directed. °3. PAIN CONTROL:  °1. Pain is best controlled by a usual combination of three different methods TOGETHER:  °1. Ice/Heat °2. Over the counter pain medication °3. Prescription pain medication °2. Most patients will experience some swelling and bruising around the incisions. Ice packs or heating pads (30-60 minutes up to 6 times a day) will help. Use ice for the first few days to help decrease swelling and bruising, then switch to heat to help relax tight/sore spots and speed recovery. Some people prefer to use ice alone, heat alone, alternating between ice & heat. Experiment to what works for you. Swelling and bruising can take several weeks to resolve.  °3. It is helpful to take an over-the-counter pain medication regularly for the first few weeks. Choose one of the following that works best for you:  °1. Naproxen (Aleve, etc) Two 220mg tabs twice a day °2. Ibuprofen (Advil, etc) Three 200mg tabs four times a day (every meal & bedtime) °3. Acetaminophen (Tylenol, etc) 500-650mg four times a day (every meal & bedtime) °4. A prescription for pain medication (such as oxycodone, hydrocodone, etc) should be given to you upon discharge. Take your pain medication as prescribed.  °1. If you are having problems/concerns with the prescription medicine (does not control pain, nausea, vomiting, rash, itching, etc), please call us [phone] to see if we need to switch you to a different pain medicine that will work better for you and/or control your side effect better. °2. If you need a refill on your pain medication, please contact your pharmacy. They will contact our office to request authorization. Prescriptions will not be filled after 5 pm or on week-ends. °4. Avoid getting constipated. Between the surgery and the pain medications, it is common to experience some constipation. Increasing fluid intake and taking a fiber supplement (such as Metamucil, Citrucel, FiberCon, MiraLax, etc) 1-2 times a day regularly will usually help prevent this problem from occurring. A mild laxative (prune juice, Milk of Magnesia, MiraLax, etc) should be taken according to package directions if there are no bowel movements after 48 hours.  °5. Watch out for diarrhea. If you have many loose bowel movements, simplify your diet to bland foods & liquids for a few days. Stop any stool softeners and decrease your fiber supplement. Switching to mild anti-diarrheal medications (Kayopectate, Pepto Bismol) can help. If this worsens or does not improve, please call us. °6. Wash / shower every day. You may shower over the dressings as they are waterproof. Continue to shower over incision(s) after the dressing is off. °7. Remove your waterproof bandages 5 days after surgery. You may leave the incision open to air. You may replace a dressing/Band-Aid to cover the incision for comfort if you wish.  °8. ACTIVITIES as tolerated:  °1. You may resume regular (light) daily activities beginning the next day--such as daily self-care, walking, climbing stairs--gradually   increasing activities as tolerated. If you can walk 30 minutes without difficulty, it is safe to try more intense activity such as jogging, treadmill, bicycling, low-impact aerobics, swimming, etc. °2. Save the most intensive and strenuous activity for last such as sit-ups, heavy lifting, contact sports, etc Refrain  from any heavy lifting or straining until you are off narcotics for pain control.  °3. DO NOT PUSH THROUGH PAIN. Let pain be your guide: If it hurts to do something, don't do it. Pain is your body warning you to avoid that activity for another week until the pain goes down. °4. You may drive when you are no longer taking prescription pain medication, you can comfortably wear a seatbelt, and you can safely maneuver your car and apply brakes. °5. You may have sexual intercourse when it is comfortable.  °9. FOLLOW UP in our office  °1. Please call [facility] at [phone] to set up an appointment to see your surgeon in the office for a follow-up appointment approximately 2-3 weeks after your surgery. °2. Make sure that you call for this appointment the day you arrive home to insure a convenient appointment time. °     10. IF YOU HAVE DISABILITY OR FAMILY LEAVE FORMS, BRING THEM TO THE               OFFICE FOR PROCESSING.  ° °WHEN TO CALL US [phone]:  °1. Poor pain control °2. Reactions / problems with new medications (rash/itching, nausea, etc)  °3. Fever over 101.5 F (38.5 C) °4. Inability to urinate °5. Nausea and/or vomiting °6. Worsening swelling or bruising °7. Continued bleeding from incision. °8. Increased pain, redness, or drainage from the incision ° °The clinic staff is available to answer your questions during regular business hours (8:30am-5pm). Please don’t hesitate to call and ask to speak to one of our nurses for clinical concerns.  °If you have a medical emergency, go to the nearest emergency room or call 911.  °A surgeon from [facility] is always on call at the hospitals  ° °[facility]  °[address], [address], [place], [place] [address] ?  °MAIN: [phone] ? TOLL FREE: [phone] ?  °FAX [phone]  °[email or web address] ° ° °Laparoscopic Cholecystectomy, Care After °This sheet gives you information about how to care for yourself after  your procedure. Your health care provider may also give you more specific instructions. If you have problems or questions, contact your health care provider. °What can I expect after the procedure? °After the procedure, it is common to have: °· Pain at your incision sites. You will be given medicines to control this pain. °· Mild nausea or vomiting. °· Bloating and possible shoulder pain from the air-like gas that was used during the procedure. ° °Follow these instructions at home: °Incision care ° °· Follow instructions from your health care provider about how to take care of your incisions. Make sure you: °? Wash your hands with soap and water before you change your bandage (dressing). If soap and water are not available, use hand sanitizer. °? Change your dressing as told by your health care provider. °? Leave stitches (sutures), skin glue, or adhesive strips in place. These skin closures may need to be in place for 2 weeks or longer. If adhesive strip edges start to loosen and curl up, you may trim the loose edges. Do not remove adhesive strips completely unless your health care provider tells you to do that. °·   Do not take baths, swim, or use a hot tub until your health care provider approves. Ask your health care provider if you can take showers. You may only be allowed to take sponge baths for bathing. °· Check your incision area every day for signs of infection. Check for: °? More redness, swelling, or pain. °? More fluid or blood. °? Warmth. °? Pus or a bad smell. °Activity °· Do not drive or use heavy machinery while taking prescription pain medicine. °· Do not lift anything that is heavier than 10 lb (4.5 kg) until your health care provider approves. °· Do not play contact sports until your health care provider approves. °· Do not drive for 24 hours if you were given a medicine to help you relax (sedative). °· Rest as needed. Do not return to work or school until your health care provider  approves. °General instructions °· Take over-the-counter and prescription medicines only as told by your health care provider. °· To prevent or treat constipation while you are taking prescription pain medicine, your health care provider may recommend that you: °? Drink enough fluid to keep your urine clear or pale yellow. °? Take over-the-counter or prescription medicines. °? Eat foods that are high in fiber, such as fresh fruits and vegetables, whole grains, and beans. °? Limit foods that are high in fat and processed sugars, such as fried and sweet foods. °Contact a health care provider if: °· You develop a rash. °· You have more redness, swelling, or pain around your incisions. °· You have more fluid or blood coming from your incisions. °· Your incisions feel warm to the touch. °· You have pus or a bad smell coming from your incisions. °· You have a fever. °· One or more of your incisions breaks open. °Get help right away if: °· You have trouble breathing. °· You have chest pain. °· You have increasing pain in your shoulders. °· You faint or feel dizzy when you stand. °· You have severe pain in your abdomen. °· You have nausea or vomiting that lasts for more than one day. °· You have leg pain. °This information is not intended to replace advice given to you by your health care provider. Make sure you discuss any questions you have with your health care provider. °Document Released: 10/27/2005 Document Revised: 05/17/2016 Document Reviewed: 04/14/2016 °Elsevier Interactive Patient Education © 2017 [employer] ° ° °

## 2017-09-30 NOTE — Anesthesia Procedure Notes (Signed)
Procedure Name: Intubation Date/Time: 09/30/2017 10:48 AM Performed by: Wilburn Cornelia, CRNA Pre-anesthesia Checklist: Patient identified, Emergency Drugs available, Suction available, Patient being monitored and Timeout performed Patient Re-evaluated:Patient Re-evaluated prior to induction Oxygen Delivery Method: Circle system utilized Preoxygenation: Pre-oxygenation with 100% oxygen Induction Type: IV induction, Rapid sequence and Cricoid Pressure applied Laryngoscope Size: Mac and 3 Grade View: Grade II Tube type: Oral Tube size: 7.0 mm Number of attempts: 1 Airway Equipment and Method: Stylet Placement Confirmation: ETT inserted through vocal cords under direct vision,  positive ETCO2,  CO2 detector and breath sounds checked- equal and bilateral Secured at: 21 cm Tube secured with: Tape Dental Injury: Teeth and Oropharynx as per pre-operative assessment

## 2017-09-30 NOTE — Op Note (Signed)
Patient Name:           Wanda Pratt   Date of Surgery:        09/30/2017  Pre op Diagnosis:      Acute and chronic cholecystitis with cholelithiasis  Post op Diagnosis:    Same  Procedure:                 Laparoscopic cholecystectomy with intraoperative cholangiogram  Surgeon:                     Angelia MouldHaywood M. Derrell LollingIngram, M.D., FACS  Assistant:                      Eustace PenKelly Raburn, PA  Operative Indications:   This is a 51 year old female with a history of bipolar disorder, hypertension, hypothyroidism, hysterectomy, morbid obesity.  She presented to the emergency room last night with a three-month history of intermittent nausea and this has become more severe and made worse with eating.  She had right upper quadrant pain.  Ultrasound showed multiple gallstones within the gallbladder.  Mildly thickened gallbladder wall..  Liver function test and white count and lipase were normal.  She was tender in the right upper quadrant.  She was admitted and started on antibiotics.  This morning she felt better.  She is brought to the operating room for cholecystectomy  Operative Findings:       The gallbladder was acutely and chronically inflamed.  There is a large stone impacted in the infundibulum of the gallbladder.  There are moderate adhesions of omentum to the gallbladder.  The duodenum was not adherent.  The  dissection was slow and tedious.  The cholangiogram was normal showing normal intra-and extrahepatic biliary anatomy, no filling defect, and prompt flow of contrast into the duodenum.  The entire biliary system was of small caliber.  The liver was large and heavy, suggesting hepatic steatosis.  There were a few omental adhesions to the umbilicus but these were not problematic.  There were no gross abnormal findings otherwise.  Procedure in Detail:          Following the induction of general endotracheal anesthesia the patient's abdomen was prepped and draped in a sterile fashion.  Intravenous  antibiotics were given.  Surgical timeout was performed.  0.5% Marcaine with epinephrine was used as local infiltration anesthetic.     A vertical incision was made in the lower rim of the umbilicus.  The fascia was incised in the midline and the abdominal cavity entered under direct vision.  An 11 mm Hassan trocar was inserted and secured with the Purstring suture of 0 Vicryl.  Pneumoperitoneum was created and video camera was inserted.  Trochars placed in subxiphoid region and two - 5 mm trochars placed in the right upper quadrant.  We could identify the fundus and elevate it.  Adhesions were slowly taken down.  We were able to grasp the lower body of the gallbladder.  I spent a long time dissecting the soft tissues away from the infundibulum.  Ultimately we identified the cystic artery anteriorly, isolated it and secured it with metal clips and divided it.  We then further dissected down and found a posterior branch of the cystic artery and controlled between metal clips.  The cystic duct was isolated.  Cholangiogram catheter was inserted into the cystic duct and a cholangiogram was obtained using the C-arm.  The cholangiogram was normal as described above.  The cholangiogram catheter  was removed.  The cystic duct was secured with multiple metal clips and divided.  The gallbladder was carefully dissected from his bed with electrocautery, placed in a specimen bag and removed.  The operative field was repeatedly and copiously irrigated.  The bed of the gallbladder was raw.  I placed a piece of SNOW   hemostatic sponge in the bed of the gallbladder.  Hemostasis seemed excellent.  At the completion of the case there was no bleeding and no bile leak.    The trochars were removed under direct vision.  There was no bleeding.  Pneumoperitoneum was released.  The fascia at the umbilicus was closed with 0 Vicryl sutures.  After irrigating the skin incisions they were closed on the skin level with subcuticular 4-0  Monocryl and Dermabond.  Patient tolerated the procedure well was taken to PACU in stable condition.  EBL 20-40 mL.  Counts correct.  Complications none.     Angelia Mould. Derrell Lolling, M.D., FACS General and Minimally Invasive Surgery Breast and Colorectal Surgery  09/30/2017 12:42 PM

## 2017-09-30 NOTE — Transfer of Care (Signed)
Immediate Anesthesia Transfer of Care Note  Patient: Wanda Pratt  Procedure(s) Performed: LAPAROSCOPIC CHOLECYSTECTOMY WITH INTRAOPERATIVE CHOLANGIOGRAM (N/A Abdomen)  Patient Location: PACU  Anesthesia Type:General  Level of Consciousness: awake  Airway & Oxygen Therapy: Patient Spontanous Breathing and Patient connected to nasal cannula oxygen  Post-op Assessment: Report given to RN and Post -op Vital signs reviewed and stable  Post vital signs: Reviewed and stable  Last Vitals:  Vitals:   09/29/17 2021 09/30/17 0540  BP: (!) 107/54 117/67  Pulse: 72 63  Resp: 18 18  Temp: 36.7 C (!) 36.4 C  SpO2: 98% 98%    Last Pain:  Vitals:   09/30/17 0844  TempSrc:   PainSc: 0-No pain         Complications: No apparent anesthesia complications

## 2017-09-30 NOTE — Anesthesia Preprocedure Evaluation (Signed)
Anesthesia Evaluation  Patient identified by MRN, date of birth, ID band Patient awake    Reviewed: Allergy & Precautions, Patient's Chart, lab work & pertinent test results  Airway Mallampati: II  TM Distance: >3 FB     Dental   Pulmonary    breath sounds clear to auscultation       Cardiovascular hypertension,  Rhythm:Regular Rate:Normal     Neuro/Psych    GI/Hepatic Neg liver ROS, GERD  ,  Endo/Other    Renal/GU negative Renal ROS     Musculoskeletal   Abdominal   Peds  Hematology   Anesthesia Other Findings   Reproductive/Obstetrics                             Anesthesia Physical Anesthesia Plan  ASA: III  Anesthesia Plan: General   Post-op Pain Management:    Induction: Intravenous  PONV Risk Score and Plan: 3 and Treatment may vary due to age or medical condition, Ondansetron, Dexamethasone, Propofol infusion and Midazolam  Airway Management Planned:   Additional Equipment:   Intra-op Plan:   Post-operative Plan: Extubation in OR  Informed Consent: I have reviewed the patients History and Physical, chart, labs and discussed the procedure including the risks, benefits and alternatives for the proposed anesthesia with the patient or authorized representative who has indicated his/her understanding and acceptance.   Dental advisory given  Plan Discussed with: CRNA and Anesthesiologist  Anesthesia Plan Comments:         Anesthesia Quick Evaluation

## 2017-09-30 NOTE — Progress Notes (Signed)
Report called and patient being transported for scheduled surgery. Patient in stable condition.

## 2017-09-30 NOTE — Anesthesia Postprocedure Evaluation (Signed)
Anesthesia Post Note  Patient: Wanda Pratt  Procedure(s) Performed: LAPAROSCOPIC CHOLECYSTECTOMY WITH INTRAOPERATIVE CHOLANGIOGRAM (N/A Abdomen)     Patient location during evaluation: PACU Anesthesia Type: General Level of consciousness: awake Pain management: pain level controlled Vital Signs Assessment: post-procedure vital signs reviewed and stable Respiratory status: spontaneous breathing Cardiovascular status: stable Anesthetic complications: no    Last Vitals:  Vitals:   09/29/17 2021 09/30/17 0540  BP: (!) 107/54 117/67  Pulse: 72 63  Resp: 18 18  Temp: 36.7 C (!) 36.4 C  SpO2: 98% 98%    Last Pain:  Vitals:   09/30/17 0844  TempSrc:   PainSc: 0-No pain                 Richetta Cubillos

## 2017-10-01 ENCOUNTER — Encounter (HOSPITAL_COMMUNITY): Payer: Self-pay | Admitting: General Surgery

## 2017-10-01 MED ORDER — ACETAMINOPHEN 325 MG PO TABS: ORAL_TABLET | ORAL | Status: DC

## 2017-10-01 MED ORDER — HYDROCODONE-ACETAMINOPHEN 5-325 MG PO TABS: 1.0000 | ORAL_TABLET | ORAL | 0 refills | Status: DC | PRN

## 2017-10-01 MED ORDER — IBUPROFEN 200 MG PO TABS: ORAL_TABLET | ORAL | 0 refills | Status: DC

## 2017-10-01 NOTE — Progress Notes (Signed)
1 Day Post-Op    CC: Abdominal pain  Subjective: Doing well this a.m. she has not had breakfast yet but was up and walking some.  Pain is fairly well-controlled with p.o. pain medications.  Port sites all look good.  She is reasonably sore and uncomfortable from the port sites.  Objective: Vital signs in last 24 hours: Temp:  [97.5 F (36.4 C)-98.3 F (36.8 C)] 97.9 F (36.6 C) (11/22 0609) Pulse Rate:  [69-78] 77 (11/22 0609) Resp:  [13-20] 17 (11/22 0609) BP: (105-147)/(56-92) 109/64 (11/22 0609) SpO2:  [95 %-100 %] 98 % (11/22 0609) Last BM Date: 09/30/17 664 po 2400 IV 1050 urine Afebrile VSS wBC is 6.2  Intake/Output from previous day: 11/21 0701 - 11/22 0700 In: 3064 [P.O.:664; I.V.:2400] Out: 1100 [Urine:1050; Blood:50] Intake/Output this shift: No intake/output data recorded.  General appearance: alert, cooperative and no distress Resp: clear to auscultation bilaterally GI: Soft, sore, no nausea or vomiting, port sites all look good.  Lab Results:  Recent Labs    09/29/17 1312 09/30/17 1520  WBC 7.8 6.2  HGB 13.6 13.3  HCT 40.3 40.5  PLT 244 236    BMET Recent Labs    09/29/17 1312 09/30/17 1520  NA 139  --   K 4.2  --   CL 104  --   CO2 29  --   GLUCOSE 98  --   BUN 12  --   CREATININE 1.00 0.99  CALCIUM 9.9  --    PT/INR No results for input(s): LABPROT, INR in the last 72 hours.  Recent Labs  Lab 09/29/17 1312  AST 22  ALT 19  ALKPHOS 61  BILITOT 0.8  PROT 7.3  ALBUMIN 3.7     Lipase     Component Value Date/Time   LIPASE 34 09/29/2017 1312     Medications: . enoxaparin (LOVENOX) injection  40 mg Subcutaneous Q24H  . gabapentin  300 mg Oral QHS  . lithium carbonate  300 mg Oral BID  . thyroid  120 mg Oral QAC breakfast  . traZODone  200 mg Oral QHS   Anti-infectives (From admission, onward)   Start     Dose/Rate Route Frequency Ordered Stop   10/01/17 1100  cefTRIAXone (ROCEPHIN) 2 g in dextrose 5 % 50 mL IVPB     2  g 100 mL/hr over 30 Minutes Intravenous Every 24 hours 09/30/17 1451     09/30/17 1700  cefTRIAXone (ROCEPHIN) 2 g in dextrose 5 % 50 mL IVPB  Status:  Discontinued     2 g 100 mL/hr over 30 Minutes Intravenous Every 24 hours 09/29/17 2026 09/30/17 1443   09/29/17 1700  cefTRIAXone (ROCEPHIN) 2 g in dextrose 5 % 50 mL IVPB     2 g 100 mL/hr over 30 Minutes Intravenous  Once 09/29/17 1654 09/29/17 1818     . cefTRIAXone (ROCEPHIN)  IV    . lactated ringers 150 mL/hr at 10/01/17 0500   Assessment/Plan Chronic and acute cholelithiasis/cholecystitis. As/P laparoscopic cholecystectomy with intraoperative cholangiogram, 09/30/17, Dr. Claud Kelp  Hypertension Hypothyroidism Bipolar disorder Morbid obesity History abdominal hysterectomy  FEN:  IV fluids/heart healthy ID:  Rocephin 11/20 =>> day 2  DVT:  Lovenox Follow up:  [facility]  Plan: Resume preadmission medicines discharge home this a.m.  After breakfast.   LOS: 1 day    Milton Streicher 10/01/2017 [phone]

## 2017-10-01 NOTE — Progress Notes (Signed)
Patient discharged to home with instructions and prescription. 

## 2019-02-23 ENCOUNTER — Other Ambulatory Visit: Payer: Self-pay | Admitting: Family Medicine

## 2019-02-23 DIAGNOSIS — R5383 Other fatigue: Secondary | ICD-10-CM

## 2019-02-23 DIAGNOSIS — R413 Other amnesia: Secondary | ICD-10-CM

## 2019-02-24 ENCOUNTER — Other Ambulatory Visit: Payer: No Typology Code available for payment source

## 2019-03-04 ENCOUNTER — Other Ambulatory Visit: Payer: Self-pay

## 2019-03-04 ENCOUNTER — Ambulatory Visit
Admission: RE | Admit: 2019-03-04 | Discharge: 2019-03-04 | Disposition: A | Discharge status: Home / Self Care | Payer: 59 | Source: Ambulatory Visit | Attending: Family Medicine | Admitting: Family Medicine

## 2019-03-04 DIAGNOSIS — R5383 Other fatigue: Secondary | ICD-10-CM

## 2019-03-04 DIAGNOSIS — R413 Other amnesia: Secondary | ICD-10-CM

## 2019-07-12 ENCOUNTER — Other Ambulatory Visit: Payer: Self-pay

## 2019-07-12 DIAGNOSIS — Z20822 Contact with and (suspected) exposure to covid-19: Secondary | ICD-10-CM

## 2019-07-13 LAB — NOVEL CORONAVIRUS, NAA: SARS-CoV-2, NAA: NOT DETECTED

## 2019-07-14 ENCOUNTER — Telehealth: Payer: Self-pay | Admitting: *Deleted

## 2019-07-14 NOTE — Telephone Encounter (Signed)
Patient is returning call for results- negative COVID. Patient was tested for exposure- advised contact PCP for changes, continue safe practices and get flu shot.

## 2019-10-18 ENCOUNTER — Other Ambulatory Visit: Payer: Self-pay

## 2019-10-18 DIAGNOSIS — Z20822 Contact with and (suspected) exposure to covid-19: Secondary | ICD-10-CM

## 2019-10-20 LAB — NOVEL CORONAVIRUS, NAA: SARS-CoV-2, NAA: NOT DETECTED

## 2020-01-05 ENCOUNTER — Other Ambulatory Visit (HOSPITAL_COMMUNITY): Payer: Self-pay | Admitting: Family Medicine

## 2020-01-05 DIAGNOSIS — R55 Syncope and collapse: Secondary | ICD-10-CM

## 2020-01-11 ENCOUNTER — Encounter: Payer: Self-pay | Admitting: Neurology

## 2020-01-11 ENCOUNTER — Other Ambulatory Visit: Payer: Self-pay

## 2020-01-11 ENCOUNTER — Ambulatory Visit: Payer: 59 | Admitting: Neurology

## 2020-01-11 VITALS — BP 135/82 | HR 69 | Temp 97.0°F | Ht 65.5 in | Wt 323.5 lb

## 2020-01-11 DIAGNOSIS — R404 Transient alteration of awareness: Secondary | ICD-10-CM

## 2020-01-11 DIAGNOSIS — R42 Dizziness and giddiness: Secondary | ICD-10-CM

## 2020-01-11 NOTE — Progress Notes (Signed)
PATIENT: Wanda Pratt DOB: 01-Mar-1966  Chief Complaint  Patient presents with  . Syncopal Episodes/Dizziness    Orthostatic Vitals: Lying: 135/82, 69, Sitting: 134/82,70, Standing: 138/82,77.  Reports daily dizzy spells that vary in severity. She does not feel positional changes make it worse. She had one episode of passing out completely. The event cause her to fracture her left fibula.   Marland Kitchen PCP    Kristen Loader, FNP     HISTORICAL  Wanda Pratt is a 54 year old female, seen in request by nurse practitioner Jillyn Ledger for evaluation of dizziness, initial evaluation was on January 11, 2020.  I have reviewed and summarized the referring note from the referring physician.  She had past medical history of hypertension, bipolar disorder, on polypharmacy treatment, lithium 300 mg twice a day, trazodone 200 mg at bedtime, Effexor 150 mg daily was added on 2020 to better control her depression.  She is also taking Metformin for prediabetes, most recent A1c was 6.8 in January 2021, she denies bilateral feet paresthesia  Around December 2020, she began to experience frequent dizziness, usually happen when she get up from seated position, couple times each week, at the end of January 2021, she was sitting in her recliner for 30 minutes, at noon, before her lunchtime, when she get up, everything went black, she fell to the ground, with left distal fibula fracture, she still wears her boots  She has obesity, recent weight gain, very sedentary lifestyle, blood pressure lying down 135/82, heart rate of 69, sitting up 134/82, heart rate of 70, standing 138/82 heart rate of 77,  I personally reviewed CT head without contrast in April 2020, no acute abnormalities Laboratory evaluation from [facility] system in 2020, drug screen was negative in September, was positive for marijuana in March 2020,   January 2021, Vitamin D was 55.7, lithium level was 0.3, normal TSH, CMP, A1c was 6.8, CBC, with  hemoglobin of 13.1,   , REVIEW OF SYSTEMS: Full 14 system review of systems performed and notable only for as above All other review of systems were negative.  ALLERGIES: Allergies  Allergen Reactions  . Penicillins Rash    Has patient had a PCN reaction causing immediate rash, facial/tongue/throat swelling, SOB or lightheadedness with hypotension: Yes Has patient had a PCN reaction causing severe rash involving mucus membranes or skin necrosis: Unknown Has patient had a PCN reaction that required hospitalization: No Has patient had a PCN reaction occurring within the last 10 years: No If all of the above answers are "NO", then may proceed with Cephalosporin use.     HOME MEDICATIONS: Current Outpatient Medications  Medication Sig Dispense Refill  . ARMOUR THYROID PO Take by mouth as directed. Taking 180mg  Mon - Fri, taking 90mg  Sat & Sun.    . benazepril (LOTENSIN) 20 MG tablet Take 20 mg by mouth daily.    Marland Kitchen gabapentin (NEURONTIN) 300 MG capsule Take 300 mg by mouth at bedtime.    . hydrOXYzine (VISTARIL) 50 MG capsule Take 50 mg by mouth in the morning, at noon, and at bedtime.     Marland Kitchen lithium carbonate 300 MG capsule Take 300 mg by mouth 2 (two) times daily.    Marland Kitchen loratadine (CLARITIN) 10 MG tablet Take 10 mg by mouth daily.    . ondansetron (ZOFRAN) 4 MG tablet Take 4 mg by mouth 2 (two) times daily as needed for nausea or vomiting.     . promethazine (PHENERGAN) 25 MG tablet Take  25 mg by mouth every 8 (eight) hours as needed for nausea/vomiting.    . ranitidine (ZANTAC) 150 MG capsule Take 150 mg by mouth 2 (two) times daily.    . traZODone (DESYREL) 100 MG tablet Take 200 mg by mouth at bedtime.    Marland Kitchen venlafaxine XR (EFFEXOR-XR) 150 MG 24 hr capsule Take 150 mg by mouth daily.     No current facility-administered medications for this visit.    PAST MEDICAL HISTORY: Past Medical History:  Diagnosis Date  . Bipolar depression (HCC)   . Chronic nausea   . Dizziness   .  Fibula fracture    left  . GERD (gastroesophageal reflux disease)   . Hypertension   . Hypothyroidism   . Insomnia   . Obesity   . OSA on CPAP   . RLS (restless legs syndrome)     PAST SURGICAL HISTORY: Past Surgical History:  Procedure Laterality Date  . ABDOMINAL HYSTERECTOMY    . CESAREAN SECTION    . CHOLECYSTECTOMY N/A 09/30/2017   Procedure: LAPAROSCOPIC CHOLECYSTECTOMY WITH INTRAOPERATIVE CHOLANGIOGRAM;  Surgeon: Claud Kelp, MD;  Location: [facility] OR;  Service: General;  Laterality: N/A;    FAMILY HISTORY: Family History  Adopted: Yes  Problem Relation Age of Onset  . Multiple sclerosis Mother   . Other Father        died at young age from a truck accident    SOCIAL HISTORY: Social History   Socioeconomic History  . Marital status: Married    Spouse name: Not on file  . Number of children: 2  . Years of education: 26  . Highest education level: High school graduate  Occupational History  . Occupation: bookkeeper  Tobacco Use  . Smoking status: Former Games developer  . Smokeless tobacco: Never Used  Substance and Sexual Activity  . Alcohol use: No  . Drug use: No  . Sexual activity: Not on file  Other Topics Concern  . Not on file  Social History Narrative   Lives at home with family.   2 cups caffeine per day.   Right-handed.   Social Determinants of Health   Financial Resource Strain:   . Difficulty of Paying Living Expenses: Not on file  Food Insecurity:   . Worried About Programme researcher, broadcasting/film/video in the Last Year: Not on file  . Ran Out of Food in the Last Year: Not on file  Transportation Needs:   . Lack of Transportation (Medical): Not on file  . Lack of Transportation (Non-Medical): Not on file  Physical Activity:   . Days of Exercise per Week: Not on file  . Minutes of Exercise per Session: Not on file  Stress:   . Feeling of Stress : Not on file  Social Connections:   . Frequency of Communication with Friends and Family: Not on file  . Frequency  of Social Gatherings with Friends and Family: Not on file  . Attends Religious Services: Not on file  . Active Member of Clubs or Organizations: Not on file  . Attends Banker Meetings: Not on file  . Marital Status: Not on file  Intimate Partner Violence:   . Fear of Current or Ex-Partner: Not on file  . Emotionally Abused: Not on file  . Physically Abused: Not on file  . Sexually Abused: Not on file     PHYSICAL EXAM   Vitals:   01/11/20 0821  BP: 135/82  Pulse: 69  Temp: (!) 97 F (36.1 C)  Weight: (!) 323 lb 8 oz (146.7 kg)  Height: 5' 5.5" (1.664 m)    Not recorded      Body mass index is 53.01 kg/m.  PHYSICAL EXAMNIATION:  Gen: NAD, conversant, well nourised, well groomed                     Cardiovascular: Regular rate rhythm, no peripheral edema, warm, nontender. Eyes: Conjunctivae clear without exudates or hemorrhage Neck: Supple, no carotid bruits. Pulmonary: Clear to auscultation bilaterally   NEUROLOGICAL EXAM:  MENTAL STATUS: Speech:    Speech is normal; fluent and spontaneous with normal comprehension.  Cognition:     Orientation to time, place and person     Normal recent and remote memory     Normal Attention span and concentration     Normal Language, naming, repeating,spontaneous speech     Fund of knowledge   CRANIAL NERVES: CN II: Visual fields are full to confrontation. Pupils are round equal and briskly reactive to light. CN III, IV, VI: extraocular movement are normal. No ptosis. CN V: Facial sensation is intact to light touch CN VII: Face is symmetric with normal eye closure  CN VIII: Hearing is normal to causal conversation. CN IX, X: Phonation is normal. CN XI: Head turning and shoulder shrug are intact  MOTOR: There is no pronator drift of out-stretched arms. Muscle bulk and tone are normal. Muscle strength is normal.  REFLEXES: Reflexes are 1 and symmetric at the biceps, triceps, knees, and absent at ankles.  Plantar responses are flexor.  SENSORY: Mildly length dependent decreased to light touch, pinprick to ankle level, and decreased vibratory sensation at toes COORDINATION: There is no trunk or limb dysmetria noted.  GAIT/STANCE: She wears her left boots, rely on her cane, antalgic gait DIAGNOSTIC DATA (LABS, IMAGING, TESTING) - I reviewed patient records, labs, notes, testing and imaging myself where available.   ASSESSMENT AND PLAN  PETREA FREDENBURG is a 54 y.o. female   Dizziness, blacking out spells, fall,  Most likely related to orthostatic blood pressure changes, potential side effect from her polypharmacy treatment  Mild length dependent sensory changes, diabetes with A1c of 6.8  She desires further evaluation, will proceed with MRI of brain, ultrasound of carotid artery, EEG   I have advised her to increase water intake, moderate exercise, weight loss  Return to clinic in 3 months with nurse practitioner Hessie Diener, M.D. Ph.D.  [facility] [address] [place], [place] [address] Ph: [phone] Fax: [phone]  CC: Soundra Pilon, FNP

## 2020-01-13 ENCOUNTER — Other Ambulatory Visit: Payer: Self-pay

## 2020-01-13 ENCOUNTER — Ambulatory Visit (HOSPITAL_COMMUNITY): Payer: 59 | Attending: Internal Medicine

## 2020-01-13 DIAGNOSIS — R55 Syncope and collapse: Secondary | ICD-10-CM | POA: Diagnosis not present

## 2020-01-13 MED ORDER — PERFLUTREN LIPID MICROSPHERE
1.0000 mL | INTRAVENOUS | Status: AC | PRN
  Administered 2020-01-13: 1 mL via INTRAVENOUS

## 2020-01-16 ENCOUNTER — Other Ambulatory Visit: Payer: Self-pay

## 2020-01-16 ENCOUNTER — Ambulatory Visit: Payer: 59 | Admitting: Neurology

## 2020-01-16 DIAGNOSIS — R404 Transient alteration of awareness: Secondary | ICD-10-CM

## 2020-01-16 DIAGNOSIS — R42 Dizziness and giddiness: Secondary | ICD-10-CM

## 2020-01-16 DIAGNOSIS — R410 Disorientation, unspecified: Secondary | ICD-10-CM

## 2020-01-18 ENCOUNTER — Other Ambulatory Visit: Payer: Self-pay | Admitting: Neurology

## 2020-01-18 MED ORDER — ALPRAZOLAM 1 MG PO TABS: ORAL_TABLET | ORAL | 0 refills | Status: DC

## 2020-01-18 NOTE — Telephone Encounter (Signed)
I spoke to the patient. She confirmed her only allergy is penicillin. Prescription request for Xanax, per MRI protocol, sent to Dr. Terrace Arabia for approval. The patent verbalized understanding that she must have a driver.

## 2020-01-18 NOTE — Addendum Note (Signed)
Addended by: Lindell Spar C on: 01/18/2020 11:46 AM   Modules accepted: Orders

## 2020-01-18 NOTE — Telephone Encounter (Signed)
no to the covid questoins MR Brain wo contrast Dr. Terrace Arabia [facility] Auth: [id number] (exp. 01/18/20 to 03/03/20) patient is scheduled at [facility] for 01/25/20. She did inform me she is claustrophic and would like something to help her. She is aware to have a driver.

## 2020-01-19 NOTE — Procedures (Signed)
   HISTORY: 54 year old female presented with dizziness, occasionally passing out episode  TECHNIQUE:  This is a routine 16 channel EEG recording with one channel devoted to a limited EKG recording.  It was performed during wakefulness, drowsiness and asleep.  Hyperventilation and photic stimulation were performed as activating procedures.  There are minimum muscle and movement artifact noted.  Upon maximum arousal, posterior dominant waking rhythm consistent of rhythmic alpha range activity, with frequency of 10 hz. Activities are symmetric over the bilateral posterior derivations and attenuated with eye opening.  Hyperventilation produced mild/moderate buildup with higher amplitude and the slower activities noted.  Photic stimulation did not alter the tracing.  During EEG recording, patient developed drowsiness and entered sleep, sleep EEG demonstrated architecture, there were frontal centrally dominant vertex waves and symmetric sleep spindles noted.  During EEG recording, there was no epileptiform discharge noted.  EKG demonstrate sinus rhythm, with heart rate of 66 bpm  CONCLUSION: This is a  normal awake and asleep EEG.  There is no electrodiagnostic evidence of epileptiform discharge.  Levert Feinstein, M.D. Ph.D.  [facility] [address] [place], [place] [address] Phone: [phone] Fax:      [phone]

## 2020-01-23 ENCOUNTER — Other Ambulatory Visit: Payer: Self-pay

## 2020-01-23 ENCOUNTER — Telehealth: Payer: Self-pay | Admitting: *Deleted

## 2020-01-23 ENCOUNTER — Ambulatory Visit (HOSPITAL_COMMUNITY)
Admission: RE | Admit: 2020-01-23 | Discharge: 2020-01-23 | Disposition: A | Discharge status: Home / Self Care | Payer: 59 | Source: Ambulatory Visit | Attending: Neurology | Admitting: Neurology

## 2020-01-23 DIAGNOSIS — R404 Transient alteration of awareness: Secondary | ICD-10-CM | POA: Diagnosis present

## 2020-01-23 DIAGNOSIS — R42 Dizziness and giddiness: Secondary | ICD-10-CM | POA: Diagnosis present

## 2020-01-23 NOTE — Telephone Encounter (Signed)
Left patient a detailed message, with results, on voicemail (ok per DPR).  Provided our number to call back with any questions.  

## 2020-01-23 NOTE — Telephone Encounter (Signed)
-----   Message from Levert Feinstein, MD sent at 01/23/2020  4:05 PM EDT ----- Please call patient: US carotid arteries showed no significant large vessel disease.

## 2020-01-25 ENCOUNTER — Ambulatory Visit: Payer: 59

## 2020-01-25 ENCOUNTER — Other Ambulatory Visit: Payer: Self-pay

## 2020-01-25 DIAGNOSIS — R42 Dizziness and giddiness: Secondary | ICD-10-CM | POA: Diagnosis not present

## 2020-01-25 DIAGNOSIS — R404 Transient alteration of awareness: Secondary | ICD-10-CM

## 2020-01-27 ENCOUNTER — Telehealth: Payer: Self-pay | Admitting: Neurology

## 2020-01-27 NOTE — Telephone Encounter (Addendum)
IMPRESSION:   MRI brain (without) demonstrating: - Multiple round and ovoid periventricular, subcortical, pericallosal T2 hyperintensities. Considerations include demyelinating, autoimmune, inflammatory, post-infectious or  microvascular ischemic etiologies.  - No acute findings.   I failed to reach patient for MRI brain result, please give her a follow up appt with me.

## 2020-01-30 NOTE — Telephone Encounter (Signed)
I spoke to the patient about the MRI results below. She has been scheduled for an appt on 02/01/20 for further discussion with Dr. Terrace Arabia.

## 2020-02-01 ENCOUNTER — Ambulatory Visit: Payer: 59 | Admitting: Neurology

## 2020-02-01 ENCOUNTER — Encounter: Payer: Self-pay | Admitting: Neurology

## 2020-02-01 ENCOUNTER — Other Ambulatory Visit: Payer: Self-pay

## 2020-02-01 VITALS — BP 128/78 | HR 88 | Temp 97.1°F | Ht 65.5 in | Wt 321.0 lb

## 2020-02-01 DIAGNOSIS — R404 Transient alteration of awareness: Secondary | ICD-10-CM

## 2020-02-01 DIAGNOSIS — R9089 Other abnormal findings on diagnostic imaging of central nervous system: Secondary | ICD-10-CM | POA: Diagnosis not present

## 2020-02-01 DIAGNOSIS — R42 Dizziness and giddiness: Secondary | ICD-10-CM | POA: Diagnosis not present

## 2020-02-01 NOTE — Progress Notes (Signed)
PATIENT: Wanda Pratt DOB: [birth year]  Chief Complaint  Patient presents with  . Dizziness/Blacking Out/Falls    She is here, with her husband Fredrik Cove, to review her MRI brain in further details.     HISTORICAL  Wanda Pratt is a 54 year old female, seen in request by nurse practitioner Peri Maris for evaluation of dizziness, initial evaluation was on January 11, 2020.  I have reviewed and summarized the referring note from the referring physician.  She had past medical history of hypertension, bipolar disorder, on polypharmacy treatment, lithium 300 mg twice a day, trazodone 200 mg at bedtime, Effexor 150 mg daily was added on 2020 to better control her depression.  She is also taking Metformin for prediabetes, most recent A1c was 6.8 in January 2021, she denies bilateral feet paresthesia  Around December 2020, she began to experience frequent dizziness, usually happen when she get up from seated position, couple times each week, at the end of January 2021, she was sitting in her recliner for 30 minutes, at noon, before her lunchtime, when she get up, everything went black, she fell to the ground, with left distal fibula fracture, she still wears her boots  She has obesity, recent weight gain, very sedentary lifestyle, blood pressure lying down 135/82, heart rate of 69, sitting up 134/82, heart rate of 70, standing 138/82 heart rate of 77,  I personally reviewed CT head without contrast in April 2020, no acute abnormalities Laboratory evaluation from [facility] system in 2020, drug screen was negative in September, was positive for marijuana in March 2020,   January 2021, Vitamin D was 55.7, lithium level was 0.3, normal TSH, CMP, A1c was 6.8, CBC, with hemoglobin of 13.1,  UPDATE February 01 2020: Patient came in with her husband today, continue complains of gait abnormality, diffuse body achy pain, especially bilateral leg, she is has very sedentary lifestyle, morbid obesity, rely on  her cane, she was raised by her adoptive family, her biological mother died of MS in her 27s,  We personally reviewed MRI of the brain without contrast on January 25 2020, multiple round, oval periventricular, subcortical, pericallosal T2 hyperintensity lesion, raised the possibility of multiple sclerosis versus small vessel disease  EEG was normal in March 2021,  Ultrasound of carotid artery, left internal carotid artery less than 39% stenosis, no significant stenosis at right internal carotid artery, bilateral vertebral artery were anterograde flow.  Echocardiogram, ejection fraction 60 to 65%, no significant wall motion abnormality  REVIEW OF SYSTEMS: Full 14 system review of systems performed and notable only for as above All other review of systems were negative.  ALLERGIES: Allergies  Allergen Reactions  . Penicillins Rash    Has patient had a PCN reaction causing immediate rash, facial/tongue/throat swelling, SOB or lightheadedness with hypotension: Yes Has patient had a PCN reaction causing severe rash involving mucus membranes or skin necrosis: Unknown Has patient had a PCN reaction that required hospitalization: No Has patient had a PCN reaction occurring within the last 10 years: No If all of the above answers are "NO", then may proceed with Cephalosporin use.     HOME MEDICATIONS: Current Outpatient Medications  Medication Sig Dispense Refill  . ARMOUR THYROID PO Take by mouth as directed. Armour - Taking 180mg  Mon - Fri, taking 90mg  Sat & Sun.    . benazepril (LOTENSIN) 20 MG tablet Take 20 mg by mouth daily.    gabapentin (NEURONTIN) 300 MG capsule Take 300 mg by mouth at bedtime.    Mon  hydrOXYzine (VISTARIL) 50 MG capsule Take 50 mg by mouth in the morning, at noon, and at bedtime.     Marland Kitchen lithium carbonate 300 MG capsule Take 300 mg by mouth 2 (two) times daily.    Marland Kitchen loratadine (CLARITIN) 10 MG tablet Take 10 mg by mouth daily.    . ondansetron (ZOFRAN) 4 MG tablet Take  4 mg by mouth 2 (two) times daily as needed for nausea or vomiting.     . promethazine (PHENERGAN) 25 MG tablet Take 25 mg by mouth every 8 (eight) hours as needed for nausea/vomiting.    . ranitidine (ZANTAC) 150 MG capsule Take 150 mg by mouth 2 (two) times daily.    . traZODone (DESYREL) 100 MG tablet Take 200 mg by mouth at bedtime.    Marland Kitchen venlafaxine XR (EFFEXOR-XR) 150 MG 24 hr capsule Take 150 mg by mouth daily.     No current facility-administered medications for this visit.    PAST MEDICAL HISTORY: Past Medical History:  Diagnosis Date  . Bipolar depression ([place])   . Chronic nausea   . Dizziness   . Fibula fracture    left  . GERD (gastroesophageal reflux disease)   . Hypertension   . Hypothyroidism   . Insomnia   . Obesity   . OSA on CPAP   . RLS (restless legs syndrome)     PAST SURGICAL HISTORY: Past Surgical History:  Procedure Laterality Date  . ABDOMINAL HYSTERECTOMY    . CESAREAN SECTION    . CHOLECYSTECTOMY N/A 09/30/2017   Procedure: LAPAROSCOPIC CHOLECYSTECTOMY WITH INTRAOPERATIVE CHOLANGIOGRAM;  Surgeon: Fanny Skates, MD;  Location: [place];  Service: General;  Laterality: N/A;    FAMILY HISTORY: Family History  Adopted: Yes  Problem Relation Age of Onset  . Multiple sclerosis Mother   . Other Father        died at young age from a truck accident    SOCIAL HISTORY: Social History   Socioeconomic History  . Marital status: Married    Spouse name: Not on file  . Number of children: 2  . Years of education: 19  . Highest education level: High school graduate  Occupational History  . Occupation: bookkeeper  Tobacco Use  . Smoking status: Former Research scientist (life sciences)  . Smokeless tobacco: Never Used  Substance and Sexual Activity  . Alcohol use: No  . Drug use: No  . Sexual activity: Not on file  Other Topics Concern  . Not on file  Social History Narrative   Lives at home with family.   2 cups caffeine per day.   Right-handed.   Social Determinants  of Health   Financial Resource Strain:   . Difficulty of Paying Living Expenses:   Food Insecurity:   . Worried About Charity fundraiser in the Last Year:   . Arboriculturist in the Last Year:   Transportation Needs:   . Film/video editor (Medical):   Marland Kitchen Lack of Transportation (Non-Medical):   Physical Activity:   . Days of Exercise per Week:   . Minutes of Exercise per Session:   Stress:   . Feeling of Stress :   Social Connections:   . Frequency of Communication with Friends and Family:   . Frequency of Social Gatherings with Friends and Family:   . Attends Religious Services:   . Active Member of Clubs or Organizations:   . Attends Archivist Meetings:   Marland Kitchen Marital Status:   Intimate Partner Violence:   .  Fear of Current or Ex-Partner:   . Emotionally Abused:   Marland Kitchen Physically Abused:   . Sexually Abused:      PHYSICAL EXAM   Vitals:   02/01/20 0825  BP: 128/78  Pulse: 88  Temp: (!) 97.1 F (36.2 C)  Weight: (!) 321 lb (145.6 kg)  Height: 5' 5.5" (1.664 m)    Not recorded      Body mass index is 52.6 kg/m.  PHYSICAL EXAMNIATION:  Gen: NAD, conversant, well nourised, well groomed                     Cardiovascular: Regular rate rhythm, no peripheral edema, warm, nontender. Eyes: Conjunctivae clear without exudates or hemorrhage Neck: Supple, no carotid bruits. Pulmonary: Clear to auscultation bilaterally   NEUROLOGICAL EXAM:  MENTAL STATUS: Speech:    Speech is normal; fluent and spontaneous with normal comprehension.  Cognition:     Orientation to time, place and person     Normal recent and remote memory     Normal Attention span and concentration     Normal Language, naming, repeating,spontaneous speech     Fund of knowledge   CRANIAL NERVES: CN II: Visual fields are full to confrontation. Pupils are round equal and briskly reactive to light. CN III, IV, VI: extraocular movement are normal. No ptosis. CN V: Facial sensation is  intact to light touch CN VII: Face is symmetric with normal eye closure  CN VIII: Hearing is normal to causal conversation. CN IX, X: Phonation is normal. CN XI: Head turning and shoulder shrug are intact  MOTOR: There is no pronator drift of out-stretched arms. Muscle bulk and tone are normal. Muscle strength is normal.  REFLEXES: Reflexes are 1 and symmetric at the biceps, triceps, knees, and absent at ankles. Plantar responses are flexor.  SENSORY: Mildly length dependent decreased to light touch, pinprick to ankle level, and decreased vibratory sensation at toes  COORDINATION: There is no trunk or limb dysmetria noted.  GAIT/STANCE: She wears her left boots, rely on her cane, antalgic gait  DIAGNOSTIC DATA (LABS, IMAGING, TESTING) - I reviewed patient records, labs, notes, testing and imaging myself where available.   ASSESSMENT AND PLAN  Wanda Pratt is a 54 y.o. female   Abnormal MRI of the brain  Differentiation diagnosis including multiple sclerosis versus small vessel disease,  Her biological mother died of multiple sclerosis in her 62s  Fluoroscopy guided lumbar puncture  Laboratory evaluation to rule out mimics  Gait abnormality, bilateral lower extremity paresthesia, dizziness spells  This could be related to orthostatic blood pressure changes,  She has obesity, sedentary lifestyle, diabetes,  EMG nerve conduction study for peripheral neuropathy  Refer to physical therapy   Levert Feinstein, M.D. Ph.D.  [facility] [address] [place], [place] [address] Ph: [phone] Fax: [phone]  CC: Soundra Pilon, FNP

## 2020-02-06 LAB — COMPREHENSIVE METABOLIC PANEL
ALT: 19 IU/L (ref 0–32)
AST: 19 IU/L (ref 0–40)
Albumin/Globulin Ratio: 1.6 (ref 1.2–2.2)
Albumin: 4.1 g/dL (ref 3.8–4.9)
Alkaline Phosphatase: 63 IU/L (ref 39–117)
BUN/Creatinine Ratio: 12 (ref 9–23)
BUN: 10 mg/dL (ref 6–24)
Bilirubin Total: 0.2 mg/dL (ref 0.0–1.2)
CO2: 22 mmol/L (ref 20–29)
Calcium: 9.6 mg/dL (ref 8.7–10.2)
Chloride: 104 mmol/L (ref 96–106)
Creatinine, Ser: 0.85 mg/dL (ref 0.57–1.00)
GFR calc Af Amer: 89 mL/min/{1.73_m2} (ref >59)
GFR calc non Af Amer: 77 mL/min/{1.73_m2} (ref >59)
Globulin, Total: 2.6 g/dL (ref 1.5–4.5)
Glucose: 120 mg/dL — ABNORMAL HIGH (ref 65–99)
Potassium: 4.3 mmol/L (ref 3.5–5.2)
Sodium: 139 mmol/L (ref 134–144)
Total Protein: 6.7 g/dL (ref 6.0–8.5)

## 2020-02-06 LAB — HIV ANTIBODY (ROUTINE TESTING W REFLEX): HIV Screen 4th Generation wRfx: NONREACTIVE

## 2020-02-06 LAB — CBC WITH DIFFERENTIAL/PLATELET
Basophils Absolute: 0.1 10*3/uL (ref 0.0–0.2)
Basos: 1 %
EOS (ABSOLUTE): 0.2 10*3/uL (ref 0.0–0.4)
Eos: 4 %
Hematocrit: 41 % (ref 34.0–46.6)
Hemoglobin: 13.1 g/dL (ref 11.1–15.9)
Immature Grans (Abs): 0 10*3/uL (ref 0.0–0.1)
Immature Granulocytes: 0 %
Lymphocytes Absolute: 1.6 10*3/uL (ref 0.7–3.1)
Lymphs: 32 %
MCH: 27.6 pg (ref 26.6–33.0)
MCHC: 32 g/dL (ref 31.5–35.7)
MCV: 86 fL (ref 79–97)
Monocytes Absolute: 0.4 10*3/uL (ref 0.1–0.9)
Monocytes: 7 %
Neutrophils Absolute: 2.8 10*3/uL (ref 1.4–7.0)
Neutrophils: 56 %
Platelets: 228 10*3/uL (ref 150–450)
RBC: 4.75 x10E6/uL (ref 3.77–5.28)
RDW: 13.1 % (ref 11.7–15.4)
WBC: 5 10*3/uL (ref 3.4–10.8)

## 2020-02-06 LAB — B. BURGDORFI ANTIBODIES: Lyme IgG/IgM Ab: <0.91 {ISR} (ref 0.00–0.90)

## 2020-02-06 LAB — VITAMIN D 25 HYDROXY (VIT D DEFICIENCY, FRACTURES): Vit D, 25-Hydroxy: 52 ng/mL (ref 30.0–100.0)

## 2020-02-06 LAB — VITAMIN B12: Vitamin B-12: 347 pg/mL (ref 232–1245)

## 2020-02-06 LAB — RPR: RPR Ser Ql: NONREACTIVE

## 2020-02-06 LAB — C-REACTIVE PROTEIN: CRP: 7 mg/L (ref 0–10)

## 2020-02-06 LAB — PROTEIN ELECTROPHORESIS
A/G Ratio: 1.1 (ref 0.7–1.7)
Albumin ELP: 3.5 g/dL (ref 2.9–4.4)
Alpha 1: 0.2 g/dL (ref 0.0–0.4)
Alpha 2: 0.7 g/dL (ref 0.4–1.0)
Beta: 1.1 g/dL (ref 0.7–1.3)
Gamma Globulin: 1.1 g/dL (ref 0.4–1.8)
Globulin, Total: 3.2 g/dL (ref 2.2–3.9)

## 2020-02-06 LAB — SEDIMENTATION RATE: Sed Rate: 17 mm/hr (ref 0–40)

## 2020-02-06 LAB — CK: Total CK: 52 U/L (ref 32–182)

## 2020-02-06 LAB — FOLATE: Folate: 6.4 ng/mL (ref >3.0)

## 2020-02-06 LAB — ANA W/REFLEX IF POSITIVE: Anti Nuclear Antibody (ANA): NEGATIVE

## 2020-02-06 LAB — HGB A1C W/O EAG: Hgb A1c MFr Bld: 6.1 % — ABNORMAL HIGH (ref 4.8–5.6)

## 2020-02-06 LAB — TSH: TSH: 0.477 u[IU]/mL (ref 0.450–4.500)

## 2020-02-17 ENCOUNTER — Other Ambulatory Visit: Payer: Self-pay

## 2020-02-17 ENCOUNTER — Ambulatory Visit
Admission: RE | Admit: 2020-02-17 | Discharge: 2020-02-17 | Disposition: A | Discharge status: Home / Self Care | Payer: 59 | Source: Ambulatory Visit | Attending: Neurology | Admitting: Neurology

## 2020-02-17 VITALS — BP 148/74 | HR 92

## 2020-02-17 DIAGNOSIS — R42 Dizziness and giddiness: Secondary | ICD-10-CM

## 2020-02-17 DIAGNOSIS — R404 Transient alteration of awareness: Secondary | ICD-10-CM

## 2020-02-17 DIAGNOSIS — R9089 Other abnormal findings on diagnostic imaging of central nervous system: Secondary | ICD-10-CM

## 2020-02-17 MED ORDER — DIAZEPAM 5 MG PO TABS
10.0000 mg | ORAL_TABLET | Freq: Once | ORAL | Status: AC
  Administered 2020-02-17: 10 mg via ORAL

## 2020-02-17 NOTE — Progress Notes (Signed)
Blood drawn from pts Right AC for blood work to be sent with LP labs. 1 attempt, pt tolerated well. Site clean and dry, tape and gauze applied.

## 2020-02-17 NOTE — Discharge Instructions (Signed)

## 2020-02-22 ENCOUNTER — Other Ambulatory Visit: Payer: Self-pay

## 2020-02-22 ENCOUNTER — Ambulatory Visit (INDEPENDENT_AMBULATORY_CARE_PROVIDER_SITE_OTHER): Payer: 59 | Admitting: Neurology

## 2020-02-22 ENCOUNTER — Ambulatory Visit: Payer: 59 | Admitting: Neurology

## 2020-02-22 DIAGNOSIS — R42 Dizziness and giddiness: Secondary | ICD-10-CM

## 2020-02-22 DIAGNOSIS — R202 Paresthesia of skin: Secondary | ICD-10-CM

## 2020-02-22 DIAGNOSIS — R404 Transient alteration of awareness: Secondary | ICD-10-CM | POA: Diagnosis not present

## 2020-02-22 DIAGNOSIS — R9089 Other abnormal findings on diagnostic imaging of central nervous system: Secondary | ICD-10-CM

## 2020-02-22 NOTE — Progress Notes (Signed)
PATIENT: Wanda Pratt DOB: 23-Sep-1966  No chief complaint on file.    HISTORICAL  Wanda Pratt is a 54 year old female, seen in request by nurse practitioner Jillyn Ledger for evaluation of dizziness, initial evaluation was on January 11, 2020.  I have reviewed and summarized the referring note from the referring physician.  She had past medical history of hypertension, bipolar disorder, on polypharmacy treatment, lithium 300 mg twice a day, trazodone 200 mg at bedtime, Effexor 150 mg daily was added on 2020 to better control her depression.  She is also taking Metformin for prediabetes, most recent A1c was 6.8 in January 2021, she denies bilateral feet paresthesia  Around December 2020, she began to experience frequent dizziness, usually happen when she get up from seated position, couple times each week, at the end of January 2021, she was sitting in her recliner for 30 minutes, at noon, before her lunchtime, when she get up, everything went black, she fell to the ground, with left distal fibula fracture, she still wears her boots  She has obesity, recent weight gain, very sedentary lifestyle, blood pressure lying down 135/82, heart rate of 69, sitting up 134/82, heart rate of 70, standing 138/82 heart rate of 77,  I personally reviewed CT head without contrast in April 2020, no acute abnormalities Laboratory evaluation from [facility] in 2020, drug screen was negative in September, was positive for marijuana in March 2020,   January 2021, Vitamin D was 55.7, lithium level was 0.3, normal TSH, CMP, A1c was 6.8, CBC, with hemoglobin of 13.1,  UPDATE February 01 2020: Patient came in with her husband today, continue complains of gait abnormality, diffuse body achy pain, especially bilateral leg, she is has very sedentary lifestyle, morbid obesity, rely on her cane, she was raised by her adoptive family, her biological mother died of MS in her 24s,  We personally reviewed MRI of the  brain without contrast on January 25 2020, multiple round, oval periventricular, subcortical, pericallosal T2 hyperintensity lesion, raised the possibility of multiple sclerosis versus small vessel disease  EEG was normal in March 2021,  Ultrasound of carotid artery, left internal carotid artery less than 39% stenosis, no significant stenosis at right internal carotid artery, bilateral vertebral artery were anterograde flow.  Echocardiogram, ejection fraction 60 to 65%, no significant wall motion abnormality  UPDATE February 22 2020: She return for electrodiagnostic study today, which was essentially normal, there was no evidence of large fiber peripheral neuropathy, or right lumbosacral radiculopathy.  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  ALLERGIES: Allergies  Allergen Reactions  . Penicillins Rash    Has patient had a PCN reaction causing immediate rash, facial/tongue/throat swelling, SOB or lightheadedness with hypotension: Yes Has  patient had a PCN reaction causing severe rash involving mucus membranes or skin necrosis: Unknown Has patient had a PCN reaction that required hospitalization: No Has patient had a PCN  reaction occurring within the last 10 years: No If all of the above answers are "NO", then may proceed with Cephalosporin use.     HOME MEDICATIONS: Current Outpatient Medications  Medication Sig Dispense Refill  . ARMOUR THYROID PO Take by mouth as directed. Armour - Taking '180mg'$  Mon - Fri, taking '90mg'$  Sat & Sun.    . benazepril (LOTENSIN) 20 MG tablet Take 20 mg by mouth daily.    Marland Kitchen gabapentin (NEURONTIN) 300 MG capsule Take 300 mg by mouth at bedtime.    . hydrOXYzine (VISTARIL) 50 MG capsule Take 50 mg by mouth in the morning, at noon, and at bedtime.     Marland Kitchen lithium carbonate 300 MG capsule Take 300 mg by mouth 2 (two) times daily.    Marland Kitchen loratadine (CLARITIN) 10 MG tablet Take 10 mg by mouth daily.    . ondansetron (ZOFRAN) 4 MG tablet Take 4 mg by mouth 2 (two) times daily as needed for nausea or vomiting.     . promethazine (PHENERGAN) 25 MG tablet Take 25 mg by mouth every 8 (eight) hours as needed for nausea/vomiting.    . ranitidine (ZANTAC) 150 MG capsule Take 150 mg by mouth 2 (two) times daily.    . traZODone (DESYREL) 100 MG tablet Take 200 mg by mouth at bedtime.    Marland Kitchen venlafaxine XR (EFFEXOR-XR) 150 MG 24 hr capsule Take 150 mg by mouth daily.     No current facility-administered medications for this visit.    PAST MEDICAL HISTORY: Past Medical History:  Diagnosis Date  . Bipolar depression ([place])   . Chronic nausea   . Dizziness   . Fibula fracture    left  . GERD (gastroesophageal reflux disease)   . Hypertension   . Hypothyroidism   . Insomnia   . Obesity   . OSA on CPAP   . RLS (restless legs syndrome)     PAST SURGICAL HISTORY: Past Surgical History:  Procedure Laterality Date  . ABDOMINAL HYSTERECTOMY    . CESAREAN SECTION    . CHOLECYSTECTOMY N/A 09/30/2017   Procedure: LAPAROSCOPIC CHOLECYSTECTOMY WITH INTRAOPERATIVE CHOLANGIOGRAM;  Surgeon: Fanny Skates, MD;  Location: [facility];  Service: General;  Laterality: N/A;    FAMILY HISTORY: Family  History  Adopted: Yes  Problem Relation Age of Onset  . Multiple sclerosis Mother   . Other Father        died at young age from a truck accident    SOCIAL HISTORY: Social History   Socioeconomic History  . Marital status: Married    Spouse name: Not on file  . Number of children: 2  . Years of education: 40  . Highest education level: High school graduate  Occupational History  . Occupation: bookkeeper  Tobacco Use  . Smoking status: Former Research scientist (life sciences)  . Smokeless tobacco: Never Used  Substance and Sexual Activity  . Alcohol use: No  . Drug use: No  . Sexual activity: Not on file  Other Topics Concern  . Not on file  Social History Narrative   Lives at home with family.   2 cups caffeine per day.   Right-handed.   Social Determinants of Health   Financial Resource Strain:   . Difficulty of Paying Living Expenses:   Food Insecurity:   . Worried About Estate manager/land agent  of Food in the Last Year:   . [place] in the Last Year:   Transportation Needs:   . Lack of Transportation (Medical):   Marland Kitchen Lack of Transportation (Non-Medical):   Physical Activity:   . Days of Exercise per Week:   . Minutes of Exercise per Session:   Stress:   . Feeling of Stress :   Social Connections:   . Frequency of Communication with Friends and Family:   . Frequency of Social Gatherings with Friends and Family:   . Attends Religious Services:   . Active Member of Clubs or Organizations:   . Attends Archivist Meetings:   Marland Kitchen Marital Status:   Intimate Partner Violence:   . Fear of Current or Ex-Partner:   . Emotionally Abused:   Marland Kitchen Physically Abused:   . Sexually Abused:      PHYSICAL EXAM   There were no vitals filed for this visit.  Not recorded      There is no height or weight on file to calculate BMI.  PHYSICAL EXAMNIATION:  Gen: NAD, conversant, well nourised, well groomed                     Cardiovascular: Regular rate rhythm, no peripheral edema, warm,  nontender. Eyes: Conjunctivae clear without exudates or hemorrhage Neck: Supple, no carotid bruits. Pulmonary: Clear to auscultation bilaterally   NEUROLOGICAL EXAM:  MENTAL STATUS: Speech:    Speech is normal; fluent and spontaneous with normal comprehension.  Cognition:     Orientation to time, place and person     Normal recent and remote memory     Normal Attention span and concentration     Normal Language, naming, repeating,spontaneous speech     Fund of knowledge   CRANIAL NERVES: CN II: Visual fields are full to confrontation. Pupils are round equal and briskly reactive to light. CN III, IV, VI: extraocular movement are normal. No ptosis. CN V: Facial sensation is intact to light touch CN VII: Face is symmetric with normal eye closure  CN VIII: Hearing is normal to causal conversation. CN IX, X: Phonation is normal. CN XI: Head turning and shoulder shrug are intact  MOTOR: There is no pronator drift of out-stretched arms. Muscle bulk and tone are normal. Muscle strength is normal.  REFLEXES: Reflexes are 1 and symmetric at the biceps, triceps, knees, and absent at ankles. Plantar responses are flexor.  SENSORY: Mildly length dependent decreased to light touch, pinprick to ankle level, and decreased vibratory sensation at toes  COORDINATION: There is no trunk or limb dysmetria noted.  GAIT/STANCE: She needs push-up to get up from seated position, steady, but limited by her big body habitus  DIAGNOSTIC DATA (LABS, IMAGING, TESTING) - I reviewed patient records, labs, notes, testing and imaging myself where available.   ASSESSMENT AND PLAN  KEIOSHA CANCRO is a 54 y.o. female   Abnormal MRI of the brain  Spinal fluid testing showed more than 5 oligoclonal bands, which was also present in serum,  Not enough evidence to support a diagnosis of multiple sclerosis at this point  Extensive laboratory evaluations showed no treatable etiology,  Her biological mother  died of multiple sclerosis in her 11s  Will repeat MRI of the brain with without contrast in 1 year, follow-up visit after that  Gait abnormality, bilateral lower extremity paresthesia, dizziness spells  This is most related to her sedentary lifestyle, obesity, laboratory evaluation also demonstrated elevated A1c 6.1  No evidence of peripheral neuropathy  I have suggested to keep moderate exercise, initiate physical therapy   Marcial Pacas, M.D. Ph.D.  [facility] [address], [place], [place] [address] Ph: [phone] Fax: [phone]  CC: Kristen Loader, FNP

## 2020-02-22 NOTE — Procedures (Signed)
Full Name: Wanda Pratt Gender: Female MRN #: [id number] Date of Birth: 12-24-1965    Visit Date: 02/22/2020 09:14 Age: 54 Years Examining Physician: Levert Feinstein, MD  Referring Physician: Levert Feinstein, MD Height: 5 feet 5 inch History: 54 year old female presented with gait abnormality, bilateral lower extremity paresthesia, dizziness  Summary of the tests: Nerve conduction study: Bilateral sural, superficial peroneal sensory responses were normal.  Bilateral tibial, peroneal to EDB motor responses were normal.  Electromyography:  Selected needle examination of right lower extremity muscles, and right lumbosacral paraspinal muscles were normal.    Conclusion: This is a normal study.  There is no electrodiagnostic evidence of large fiber neuropathy or right lumbosacral radiculopathy.    ------------------------------- Levert Feinstein, M.D. PhD  [facility] [address] [place], [place] [address] Tel: [phone] Fax: [phone]         [facility]    Nerve / Sites Muscle Latency Ref. Amplitude Ref. Rel Amp Segments Distance Velocity Ref. Area    ms ms mV mV %  cm m/s m/s mVms  R Peroneal - EDB     Ankle EDB 4.4 ?6.5 5.6 ?2.0 100 Ankle - EDB 9   19.1     Fib head EDB 10.5  5.2  93.3 Fib head - Ankle 29 48 ?44 18.8     Pop fossa EDB 12.5  4.3  82.3 Pop fossa - Fib head 10 49 ?44 15.4         Pop fossa - Ankle      L Peroneal - EDB     Ankle EDB 6.0 ?6.5 3.9 ?2.0 100 Ankle - EDB 9   11.7     Fib head EDB 11.8  3.8  98.5 Fib head - Ankle 29 49 ?44 13.4     Pop fossa EDB 14.0  3.5  92.2 Pop fossa - Fib head 10 45 ?44 10.8         Pop fossa - Ankle      R Tibial - AH     Ankle AH 4.1 ?5.8 7.9 ?4.0 100 Ankle - AH 9   18.8     Pop fossa AH 13.0  6.8  85.8 Pop fossa - Ankle 38 43 ?41 21.2  L Tibial - AH     Ankle AH 5.3 ?5.8 7.8 ?4.0 100 Ankle - AH 9   20.9     Pop fossa AH 14.0  7.3  94.1 Pop fossa - Ankle 37 42 ?41 20.4             SNC      Nerve / Sites Rec. Site Peak Lat Ref.  Amp Ref. Segments Distance    ms ms V V  cm  R Sural - Ankle (Calf)     Calf Ankle 3.8 ?4.4 11 ?6 Calf - Ankle 14  L Sural - Ankle (Calf)     Calf Ankle 4.2 ?4.4 6 ?6 Calf - Ankle 14  R Superficial peroneal - Ankle     Lat leg Ankle 4.4 ?4.4 7 ?6 Lat leg - Ankle 14  L Superficial peroneal - Ankle     Lat leg Ankle 4.0 ?4.4 6 ?6 Lat leg - Ankle 14             F  Wave    Nerve F Lat Ref.   ms ms  R Tibial - AH 54.6 ?56.0  L Tibial - AH 55.4 ?56.0  EMG Summary Table    Spontaneous MUAP Recruitment  Muscle IA Fib PSW Fasc Other Amp Dur. Poly Pattern  R. Tibialis anterior Normal None None None _______ Normal Normal Normal Normal  R. Tibialis posterior Normal None None None _______ Normal Normal Normal Normal  R. Gastrocnemius (Medial head) Normal None None None _______ Normal Normal Normal Normal  R. Lumbar paraspinals Normal None None None _______ Normal Normal Normal Normal  R. Lumbar paraspinals (mid) Normal None None None _______ Normal Normal Normal Normal  R. Lumbar paraspinals (low) Normal None None None _______ Normal Normal Normal Normal  R. Peroneus longus Normal None None None _______ Normal Normal Normal Normal

## 2020-02-26 LAB — MULTIPLE SCLEROSIS PANEL 2
Albumin Serum: 3.9 g/dL (ref 3.5–5.2)
Albumin, CSF: 13.6 mg/dL (ref 8.0–42.0)
CNS-IgG Synthesis Rate: 0.5 mg/24 h (ref <=3.3)
IgG (Immunoglobin G), Serum: 1240 mg/dL (ref 600–1640)
IgG Total CSF: 3 mg/dL (ref 0.8–7.7)
IgG-Index: 0.69 — ABNORMAL HIGH (ref <0.66)
Myelin Basic Protein: <2 mcg/L (ref 2.0–4.0)

## 2020-02-26 LAB — GRAM STAIN
MICRO NUMBER:: 10346017
SPECIMEN QUALITY:: ADEQUATE

## 2020-02-26 LAB — CSF CELL COUNT WITH DIFFERENTIAL
RBC Count, CSF: 97 cells/uL — ABNORMAL HIGH
WBC, CSF: 3 cells/uL (ref 0–5)

## 2020-02-26 LAB — GLUCOSE, CSF: Glucose, CSF: 79 mg/dL (ref 40–80)

## 2020-02-26 LAB — VDRL, CSF: VDRL Quant, CSF: NONREACTIVE

## 2020-02-26 LAB — PROTEIN, CSF: Total Protein, CSF: 30 mg/dL (ref 15–45)

## 2020-03-01 ENCOUNTER — Ambulatory Visit: Payer: 59

## 2020-03-22 ENCOUNTER — Ambulatory Visit: Payer: 59 | Admitting: Physical Therapy

## 2021-02-25 ENCOUNTER — Ambulatory Visit (INDEPENDENT_AMBULATORY_CARE_PROVIDER_SITE_OTHER): Payer: No Typology Code available for payment source | Admitting: Neurology

## 2021-02-25 ENCOUNTER — Other Ambulatory Visit: Payer: Self-pay

## 2021-02-25 ENCOUNTER — Telehealth: Payer: Self-pay | Admitting: Neurology

## 2021-02-25 ENCOUNTER — Encounter: Payer: Self-pay | Admitting: Neurology

## 2021-02-25 VITALS — BP 137/72 | HR 75 | Ht 65.0 in | Wt 311.0 lb

## 2021-02-25 DIAGNOSIS — R42 Dizziness and giddiness: Secondary | ICD-10-CM

## 2021-02-25 DIAGNOSIS — R9089 Other abnormal findings on diagnostic imaging of central nervous system: Secondary | ICD-10-CM | POA: Diagnosis not present

## 2021-02-25 NOTE — Telephone Encounter (Signed)
Yeah I need a new order put in.

## 2021-02-25 NOTE — Patient Instructions (Addendum)
Will check MRI of the brain  Drink plenty of water before and after MRI Check back in 6 months

## 2021-02-25 NOTE — Telephone Encounter (Signed)
Need a new order for MRI? Dr. Terrace Arabia placed an order last year to be done April 2022.

## 2021-02-25 NOTE — Addendum Note (Signed)
Addended by: Glean Salvo on: 02/25/2021 03:31 PM   Modules accepted: Orders

## 2021-02-25 NOTE — Progress Notes (Signed)
PATIENT: Wanda Pratt DOB: August 07, 1966  Chief Complaint  Patient presents with  . Follow-up    New rm, alone, weakness in arms hands and legs, balance issues, burry vision, muscles ache in both legs, dizziness      HISTORICAL  Wanda Pratt is a 55 year old female, seen in request by nurse practitioner Jillyn Ledger for evaluation of dizziness, initial evaluation was on January 11, 2020.  I have reviewed and summarized the referring note from the referring physician.  She had past medical history of hypertension, bipolar disorder, on polypharmacy treatment, lithium 300 mg twice a day, trazodone 200 mg at bedtime, Effexor 150 mg daily was added on 2020 to better control her depression.  She is also taking Metformin for prediabetes, most recent A1c was 6.8 in January 2021, she denies bilateral feet paresthesia  Around December 2020, she began to experience frequent dizziness, usually happen when she get up from seated position, couple times each week, at the end of January 2021, she was sitting in her recliner for 30 minutes, at noon, before her lunchtime, when she get up, everything went black, she fell to the ground, with left distal fibula fracture, she still wears her boots  She has obesity, recent weight gain, very sedentary lifestyle, blood pressure lying down 135/82, heart rate of 69, sitting up 134/82, heart rate of 70, standing 138/82 heart rate of 77,  I personally reviewed CT head without contrast in April 2020, no acute abnormalities Laboratory evaluation from [facility] in 2020, drug screen was negative in September, was positive for marijuana in March 2020,   January 2021, Vitamin D was 55.7, lithium level was 0.3, normal TSH, CMP, A1c was 6.8, CBC, with hemoglobin of 13.1,  UPDATE February 01 2020: Patient came in with her husband today, continue complains of gait abnormality, diffuse body achy pain, especially bilateral leg, she is has very sedentary lifestyle, morbid  obesity, rely on her cane, she was raised by her adoptive family, her biological mother died of MS in her 69s,  We personally reviewed MRI of the brain without contrast on January 25 2020, multiple round, oval periventricular, subcortical, pericallosal T2 hyperintensity lesion, raised the possibility of multiple sclerosis versus small vessel disease  EEG was normal in March 2021,  Ultrasound of carotid artery, left internal carotid artery less than 39% stenosis, no significant stenosis at right internal carotid artery, bilateral vertebral artery were anterograde flow.  Echocardiogram, ejection fraction 60 to 65%, no significant wall motion abnormality  UPDATE February 22 2020: She return for electrodiagnostic study today, which was essentially normal, there was no evidence of large fiber peripheral neuropathy, or right lumbosacral radiculopathy.  She continue complains of diffuse body achy pain, overweight, has been very sedentary  She had lumbar puncture on 02/17/2020, there was more than 5 well defined oligoclonal band, that are observed in the CSF with additional identical, restriction band in CSF and serum, this is indicative of intracerebral as well as of systemic synthesis of gammaglobulin, normal myelin basic protein, RBC of 97, WBC of 3, negative VDRL, total protein of 30, glucose of 79  Extensive laboratory evaluation in March 2021 showed normal or  negative CMP, Lyme titer, CBC, vitamin D, ANA, protein electrophoresis, ESR, C-reactive protein, CPK, TSH, folic acid, vitamin H82, RPR, HIV, A1c was elevated 6.1,  Update February 25, 2021 SS: Here today alone, still issues with balance, is no worse, has pain in legs achy, mostly during day. Using cane, no falls. Still  has dizziness, random attacks of dizziness, lay in bed, last few days, 1 episode every 6 months, several years, feels nauseated. No major health issues. Can only stand and walk few minutes at a time, hasn't done PT, didn't think she  could endure it. Weight is 311 lbs, BMI 51. When standing feels weak, like legs will give out. Lives with husband, 2 daughter, works 4 hours a day accounting, all she can do, is very sedentary. Can lose focus and thoughts, gets distracted easily. Mood disorder, under care of psychiatry, depression has been worse lately, seeing therapist soon, stress at home, 2 adult daughters have moved in.   Blood work done on 02/07/21 by PCP, creatinine 1.10, GFR 59, A1C 6.0  REVIEW OF SYSTEMS: Full 14 system review of systems performed and notable only for as above  See HPI  ALLERGIES: Allergies  Allergen Reactions  . Penicillins Rash    Has patient had a PCN reaction causing immediate rash, facial/tongue/throat swelling, SOB or lightheadedness with hypotension: Yes Has patient had a PCN reaction causing severe rash involving mucus membranes or skin necrosis: Unknown Has patient had a PCN reaction that required hospitalization: No Has patient had a PCN reaction occurring within the last 10 years: No If all of the above answers are "NO", then may proceed with Cephalosporin use.     HOME MEDICATIONS: Current Outpatient Medications  Medication Sig Dispense Refill  . ARMOUR THYROID PO Take by mouth as directed. Armour - Taking 161m Mon - Fri, taking 970mSat & Sun.    . benazepril (LOTENSIN) 20 MG tablet Take 20 mg by mouth daily.    . Marland Kitchenabapentin (NEURONTIN) 300 MG capsule Take 300 mg by mouth at bedtime.    . hydrOXYzine (VISTARIL) 50 MG capsule Take 50 mg by mouth in the morning, at noon, and at bedtime.     . Marland Kitchenithium carbonate 300 MG capsule Take 300 mg by mouth 2 (two) times daily.    . Marland Kitchenoratadine (CLARITIN) 10 MG tablet Take 10 mg by mouth daily.    . ondansetron (ZOFRAN) 4 MG tablet Take 4 mg by mouth 2 (two) times daily as needed for nausea or vomiting.     . promethazine (PHENERGAN) 25 MG tablet Take 25 mg by mouth every 8 (eight) hours as needed for nausea/vomiting.    . ranitidine (ZANTAC) 150  MG capsule Take 150 mg by mouth 2 (two) times daily.    . traZODone (DESYREL) 100 MG tablet Take 200 mg by mouth at bedtime.    . Marland Kitchenenlafaxine XR (EFFEXOR-XR) 150 MG 24 hr capsule Take 150 mg by mouth daily.     No current facility-administered medications for this visit.    PAST MEDICAL HISTORY: Past Medical History:  Diagnosis Date  . Bipolar depression ([facility]  . Chronic nausea   . Dizziness   . Fibula fracture    left  . GERD (gastroesophageal reflux disease)   . Hypertension   . Hypothyroidism   . Insomnia   . Obesity   . OSA on CPAP   . RLS (restless legs syndrome)     PAST SURGICAL HISTORY: Past Surgical History:  Procedure Laterality Date  . ABDOMINAL HYSTERECTOMY    . CESAREAN SECTION    . CHOLECYSTECTOMY N/A 09/30/2017   Procedure: LAPAROSCOPIC CHOLECYSTECTOMY WITH INTRAOPERATIVE CHOLANGIOGRAM;  Surgeon: InFanny SkatesMD;  [facility] Service: General;  Laterality: N/A;    FAMILY HISTORY: Family History  Adopted: Yes  Problem Relation Age of  Onset  . Multiple sclerosis Mother   . Other Father        died at young age from a truck accident    SOCIAL HISTORY: Social History   Socioeconomic History  . Marital status: Married    Spouse name: Not on file  . Number of children: 2  . Years of education: 93  . Highest education level: High school graduate  Occupational History  . Occupation: bookkeeper  Tobacco Use  . Smoking status: Former Research scientist (life sciences)  . Smokeless tobacco: Never Used  Substance and Sexual Activity  . Alcohol use: No  . Drug use: No  . Sexual activity: Not on file  Other Topics Concern  . Not on file  Social History Narrative   Lives at home with family.   2 cups caffeine per day.   Right-handed.   Social Determinants of Health   Financial Resource Strain: Not on file  Food Insecurity: Not on file  Transportation Needs: Not on file  Physical Activity: Not on file  Stress: Not on file  Social Connections: Not on file   Intimate Partner Violence: Not on file   PHYSICAL EXAM   Vitals:   02/25/21 1054  BP: 137/72  Pulse: 75  Weight: (!) 311 lb (141.1 kg)  Height: _0  (1.651 m)   Not recorded     Body mass index is 51.75 kg/m.  PHYSICAL EXAMNIATION:  Gen: NAD, conversant, well nourised, well groomed                     Cardiovascular: Regular rate rhythm, no peripheral edema, warm, nontender. Eyes: Conjunctivae clear without exudates or hemorrhage Pulmonary: Clear to auscultation bilaterally   NEUROLOGICAL EXAM:  MENTAL STATUS: Speech:    Speech is normal; fluent and spontaneous with normal comprehension.  Cognition:     Orientation to time, place and person     Normal recent and remote memory     Normal Attention span and concentration     Normal Language, naming, repeating,spontaneous speech     Fund of knowledge   CRANIAL NERVES: CN II: Visual fields are full to confrontation. Pupils are round equal and briskly reactive to light. CN III, IV, VI: extraocular movement are normal. No ptosis. CN V: Facial sensation is intact to light touch CN VII: Face is symmetric with normal eye closure  CN VIII: Hearing is normal to causal conversation. CN IX, X: Phonation is normal. CN XI: Head turning and shoulder shrug are intact  MOTOR: Good strength all extremities, no muscle weakness noted.  REFLEXES: Decreased throughout  SENSORY: Intact to soft touch  COORDINATION: Finger-nose-finger and heel-to-shin is normal  GAIT/STANCE: She needs push-up to get up from seated position, steady, but limited by her large body habitus  DIAGNOSTIC DATA (LABS, IMAGING, TESTING) - I reviewed patient records, labs, notes, testing and imaging myself where available.   ASSESSMENT AND PLAN  Wanda Pratt is a 55 y.o. female   1. Abnormal MRI of the brain -Spinal fluid testing showed more than 5 oligoclonal bands, which was also present in serum  -Not enough evidence to support a diagnosis  of multiple sclerosis at this point -Extensive laboratory evaluations showed no treatable etiology, -Her biological mother died of multiple sclerosis in her 86s -Will repeat MRI of the brain with and without contrast, in comparison to 1 year ago   2. Gait abnormality, bilateral lower extremity paresthesia, dizziness spells -Felt likely related to sedentary lifestyle, obesity, A1c 6.0  -  EMG/NCV showed no evidence of peripheral neuropathy -Did not feel she would be able to endure physical therapy  I spent 30 minutes of face-to-face and non-face-to-face time with patient.  This included previsit chart review, lab review, study review, order entry, electronic health record documentation, patient education.  Evangeline Dakin, DNP  [facility] [address], [place], [place] [address] [phone]

## 2021-02-26 ENCOUNTER — Telehealth: Payer: Self-pay | Admitting: Neurology

## 2021-02-26 NOTE — Telephone Encounter (Signed)
[employer] all savers pending faxed notes

## 2021-03-04 NOTE — Telephone Encounter (Signed)
[employer] all savers auth: [id number] (exp. 02/28/21 to 04/14/21). Called patient this morning and LVM to schedule.  

## 2021-03-04 NOTE — Telephone Encounter (Signed)
[employer] all savers auth: [id number] (exp. 02/28/21 to 04/14/21). Called patient this morning and LVM to schedule.

## 2021-03-06 MED ORDER — ALPRAZOLAM 1 MG PO TABS: ORAL_TABLET | ORAL | 0 refills | Status: DC

## 2021-03-06 NOTE — Telephone Encounter (Signed)
Scheduled patient's MRI for 5/3. Patient is claustrophobic and would like Xanax or Valium to be ordered for her if possible. Advised patient that after taking this medication she will need to have a driver.

## 2021-03-06 NOTE — Telephone Encounter (Signed)
I will send in Xanax, as Dr. Terrace Arabia sent last year.   Meds ordered this encounter  Medications  . ALPRAZolam (XANAX) 1 MG tablet    Sig: Take 1-2 tablets 30 minutes before MRI, may take an additional before entering scanner if needed; MUST HAVE DRIVER    Dispense:  3 tablet    Refill:  0    Order Specific Question:   Supervising Provider    Answer:   Levert Feinstein [phone]

## 2021-03-06 NOTE — Telephone Encounter (Signed)
LMVM for pt that medication called to pharmacy for MRI, need driver. She is to call back if questions.

## 2021-03-06 NOTE — Addendum Note (Signed)
Addended by: Glean Salvo on: 03/06/2021 09:21 AM   Modules accepted: Orders

## 2021-03-12 ENCOUNTER — Ambulatory Visit: Payer: No Typology Code available for payment source

## 2021-03-12 DIAGNOSIS — R9089 Other abnormal findings on diagnostic imaging of central nervous system: Secondary | ICD-10-CM | POA: Diagnosis not present

## 2021-03-12 DIAGNOSIS — R42 Dizziness and giddiness: Secondary | ICD-10-CM | POA: Diagnosis not present

## 2021-03-12 MED ORDER — GADOBENATE DIMEGLUMINE 529 MG/ML IV SOLN
20.0000 mL | Freq: Once | INTRAVENOUS | Status: AC | PRN
  Administered 2021-03-12: 20 mL via INTRAVENOUS

## 2021-03-14 ENCOUNTER — Telehealth: Payer: Self-pay | Admitting: Neurology

## 2021-03-14 NOTE — Telephone Encounter (Signed)
Please call patient, MRI of brain showed no change compared to previous MRI scan  If she has worsening symptoms, she may come in early, otherwise keep follow-up as previously scheduled

## 2021-03-18 ENCOUNTER — Encounter: Payer: Self-pay | Admitting: Neurology

## 2021-03-18 NOTE — Telephone Encounter (Signed)
Pt was made of the MRI findings through mychart and asked for a call to discuss further questions she has.   Attempted to call the patient, there was no answer. LVM asking for a call back.

## 2021-03-27 ENCOUNTER — Encounter: Payer: Self-pay | Admitting: Neurology

## 2021-06-04 NOTE — Progress Notes (Addendum)
NEUROLOGY CONSULTATION NOTE  Wanda Pratt MRN: [id number] DOB: [birth year]  Referring provider: Jillyn Ledger, FNP Primary care provider: Jillyn Ledger, FNP  Reason for consult:  second opinion regarding symptoms and MRI findings  Assessment/Plan:   Abnormal white matter on MRI of brain - appearance is concerning for possible demyelinating disease (multiple sclerosis).  CSF showed bands in the CSF although it was also in the serum, suggestive of a systemic etiology.  However, IgG index was elevated.  I would like to check MRI of cervical and thoracic spine with and without contrast to evaluate for evidence of demyelination.  Check SSA/SSB antibodies and pan-ANCA in case findings may be suggestive of a vasculitis MRI of cervical and thoracic spine with and without contrast - if there is evidence of plaques, then a diagnosis of MS is established.   Given blurred vision, will refer to ophthalmology If workup is unrevealing, would refer to an [place] specialist Follow up after testing.    Subjective:  Wanda Pratt is a 55 year old right-handed female with HTN, hypothyroidism, prediabetes, OSA, RLS, chronic nausea and Bipolar depression who presents for second opinion regarding dizziness and syncope.  History supplemented by neurology and referring provider's notes.  She began experiencing dizziness and nausea around 2020.  Dizziness was described as a spinning or lightheadedness.  It would typically happen upon standing from a seated position and occur a couple of times a week.  In late January 2021, she was sitting in her recliner and when she got up, she felt dizzy and passed out, falling and sustaining a left distal fibula fracture.  She endorsed difficulty with balance and aching pain in the legs as well as paresthesias.  She also reports blurred vision, muscle spasms and cramps.  She also endorses memory problems and fatigue.  NCV-EMG of the lower extremities on 02/22/2020 was normal.   Continued to have transient episodes of dizziness.  Labs at that time were unremarkable, including CBC, CMP, TSH, and vit D.  Echocardiogram was unremarkable with LVEF 60-65%.  She saw neurology.  Awake and asleep EEG on 01/16/2020 was normal.  Serum labs were unremarkable, including ANA, ESR, CRP, CK, TSH, E75, folic acid, Lyme, RPR, HIV, Lyme, vit D, CBC and CMP.  MRI of brain with and without contrast on 01/25/2020 showed multiple round and ovoid periventricular subcortical and pericallosal T2 hyperintensities, non-enhancing.  Her biological mother reportedly died of complications from multiple sclerosis when she was in her 24s.  She underwent lumbar puncture on 02/17/2020 which demonstrated CSF cell count 3, protein 30, glucose 79, negative myelin basic protein, negative gram stain, negative VDRL but did show mildly elevated IgG index 0.69 and over 5 bands in the CSF as well as serum.  She had a repeat MRI of the brain with and without contrast on 03/12/2021 which showed no acute changes and was stable compared to prior imaging from 01/25/2020.  Her prior neurologist felt that her weakness was related to deconditioning given her sedentary lifestyle.  She also reports arthritis in the knees.     PAST MEDICAL HISTORY: Past Medical History:  Diagnosis Date   Bipolar depression ([place])    Chronic nausea    Dizziness    Fibula fracture    left   GERD (gastroesophageal reflux disease)    Hypertension    Hypothyroidism    Insomnia    Obesity    OSA on CPAP    RLS (restless legs syndrome)     PAST  SURGICAL HISTORY: Past Surgical History:  Procedure Laterality Date   ABDOMINAL HYSTERECTOMY     CESAREAN SECTION     CHOLECYSTECTOMY N/A 09/30/2017   Procedure: LAPAROSCOPIC CHOLECYSTECTOMY WITH INTRAOPERATIVE CHOLANGIOGRAM;  Surgeon: Fanny Skates, MD;  Location: [facility];  Service: General;  Laterality: N/A;    MEDICATIONS: Current Outpatient Medications on File Prior to Visit  Medication Sig Dispense  Refill   ALPRAZolam (XANAX) 1 MG tablet Take 1-2 tablets 30 minutes before MRI, may take an additional before entering scanner if needed; MUST HAVE DRIVER 3 tablet 0   ARMOUR THYROID PO Take by mouth as directed. Armour - Taking 140m Mon - Fri, taking 960mSat & Sun.     benazepril (LOTENSIN) 20 MG tablet Take 20 mg by mouth daily.     gabapentin (NEURONTIN) 300 MG capsule Take 300 mg by mouth at bedtime.     hydrOXYzine (VISTARIL) 50 MG capsule Take 50 mg by mouth in the morning, at noon, and at bedtime.      lithium carbonate 300 MG capsule Take 300 mg by mouth 2 (two) times daily.     loratadine (CLARITIN) 10 MG tablet Take 10 mg by mouth daily.     ondansetron (ZOFRAN) 4 MG tablet Take 4 mg by mouth 2 (two) times daily as needed for nausea or vomiting.      promethazine (PHENERGAN) 25 MG tablet Take 25 mg by mouth every 8 (eight) hours as needed for nausea/vomiting.     ranitidine (ZANTAC) 150 MG capsule Take 150 mg by mouth 2 (two) times daily.     traZODone (DESYREL) 100 MG tablet Take 200 mg by mouth at bedtime.     venlafaxine XR (EFFEXOR-XR) 150 MG 24 hr capsule Take 150 mg by mouth daily.     No current facility-administered medications on file prior to visit.    ALLERGIES: Allergies  Allergen Reactions   Penicillins Rash    Has patient had a PCN reaction causing immediate rash, facial/tongue/throat swelling, SOB or lightheadedness with hypotension: Yes Has patient had a PCN reaction causing severe rash involving mucus membranes or skin necrosis: Unknown Has patient had a PCN reaction that required hospitalization: No Has patient had a PCN reaction occurring within the last 10 years: No If all of the above answers are "NO", then may proceed with Cephalosporin use.     FAMILY HISTORY: Family History  Adopted: Yes  Problem Relation Age of Onset   Multiple sclerosis Mother    Other Father        died at young age from a truck accident    Objective:  Blood pressure  130/88, pulse 74, resp. rate 18, height _0  (1.651 m), weight (!) 313 lb (142 kg), SpO2 99 %. General: No acute distress.  Patient appears well-groomed.   Head:  Normocephalic/atraumatic Eyes:  fundi examined but not visualized Neck: supple, no paraspinal tenderness, full range of motion Back: No paraspinal tenderness Heart: regular rate and rhythm Lungs: Clear to auscultation bilaterally. Vascular: No carotid bruits. Neurological Exam: Mental status: alert and oriented to person, place, and time, speech fluent and not dysarthric, language intact. Cranial nerves: CN I: not tested CN II: pupils equal, round and reactive to light, visual fields intact CN III, IV, VI:  full range of motion, no nystagmus, no ptosis CN V: facial sensation intact. CN VII: upper and lower face symmetric CN VIII: hearing intact CN IX, X: gag intact, uvula midline CN XI: sternocleidomastoid and trapezius muscles intact CN  XII: tongue midline Bulk & Tone: normal, no fasciculations. Motor:  Muscle strength 5/5 throughout Sensation:  Pinprick sensation intact.  Reduced vibratory sensation in toes of left foot. Deep Tendon Reflexes:  2+ throughout,  toes downgoing.   Finger to nose testing:  Without dysmetria.   Heel to shin:  Without dysmetria.   Gait:  Mildly wide-based and cautious. Ambulates with cane.  Romberg with mild sway.    Thank you for allowing me to take part in the care of this patient.  Metta Clines, DO  CC: Jillyn Ledger, FNP

## 2021-06-05 ENCOUNTER — Other Ambulatory Visit: Payer: Self-pay

## 2021-06-05 ENCOUNTER — Encounter: Payer: Self-pay | Admitting: Neurology

## 2021-06-05 ENCOUNTER — Ambulatory Visit (INDEPENDENT_AMBULATORY_CARE_PROVIDER_SITE_OTHER): Payer: No Typology Code available for payment source | Admitting: Neurology

## 2021-06-05 ENCOUNTER — Other Ambulatory Visit (INDEPENDENT_AMBULATORY_CARE_PROVIDER_SITE_OTHER): Payer: No Typology Code available for payment source

## 2021-06-05 VITALS — BP 130/88 | HR 74 | Resp 18 | Ht 65.0 in | Wt 313.0 lb

## 2021-06-05 DIAGNOSIS — H538 Other visual disturbances: Secondary | ICD-10-CM

## 2021-06-05 DIAGNOSIS — R9082 White matter disease, unspecified: Secondary | ICD-10-CM

## 2021-06-05 DIAGNOSIS — R42 Dizziness and giddiness: Secondary | ICD-10-CM | POA: Diagnosis not present

## 2021-06-05 DIAGNOSIS — R202 Paresthesia of skin: Secondary | ICD-10-CM

## 2021-06-05 DIAGNOSIS — R531 Weakness: Secondary | ICD-10-CM | POA: Diagnosis not present

## 2021-06-05 DIAGNOSIS — R200 Anesthesia of skin: Secondary | ICD-10-CM

## 2021-06-05 NOTE — Patient Instructions (Signed)
MRI of cervical and thoracic spine with and without contrast Check labs - SSA/SSB antibodies, pan-ANCA Refer to ophthalmologist Dr. Wynell Balloon at [facility] Further recommendations pending results.  Follow up after testing.

## 2021-06-06 ENCOUNTER — Encounter: Payer: Self-pay | Admitting: Neurology

## 2021-06-06 LAB — ANCA SCREEN W REFLEX TITER: ANCA Screen: NEGATIVE

## 2021-06-06 LAB — SJOGREN'S SYNDROME ANTIBODS(SSA + SSB)
SSA (Ro) (ENA) Antibody, IgG: <1 AI
SSB (La) (ENA) Antibody, IgG: <1 AI

## 2021-06-19 MED ORDER — DIAZEPAM 5 MG PO TABS: ORAL_TABLET | ORAL | 0 refills | Status: DC

## 2021-06-22 ENCOUNTER — Other Ambulatory Visit: Payer: Self-pay

## 2021-06-22 ENCOUNTER — Ambulatory Visit
Admission: RE | Admit: 2021-06-22 | Discharge: 2021-06-22 | Disposition: A | Discharge status: Home / Self Care | Payer: No Typology Code available for payment source | Source: Ambulatory Visit | Attending: Neurology | Admitting: Neurology

## 2021-06-22 DIAGNOSIS — R9082 White matter disease, unspecified: Secondary | ICD-10-CM

## 2021-06-22 DIAGNOSIS — R42 Dizziness and giddiness: Secondary | ICD-10-CM

## 2021-06-22 DIAGNOSIS — R200 Anesthesia of skin: Secondary | ICD-10-CM

## 2021-06-22 DIAGNOSIS — R202 Paresthesia of skin: Secondary | ICD-10-CM

## 2021-06-22 DIAGNOSIS — H538 Other visual disturbances: Secondary | ICD-10-CM

## 2021-06-22 DIAGNOSIS — R531 Weakness: Secondary | ICD-10-CM

## 2021-06-22 MED ORDER — GADOBENATE DIMEGLUMINE 529 MG/ML IV SOLN
20.0000 mL | Freq: Once | INTRAVENOUS | Status: AC | PRN
  Administered 2021-06-22: 20 mL via INTRAVENOUS

## 2021-07-10 ENCOUNTER — Telehealth: Payer: Self-pay

## 2021-07-10 NOTE — Telephone Encounter (Signed)
New message   Office notes fax to Ophthalmology  Fax # [phone] 

## 2021-09-12 ENCOUNTER — Encounter: Payer: Self-pay | Admitting: Neurology

## 2021-09-12 ENCOUNTER — Ambulatory Visit: Payer: No Typology Code available for payment source | Admitting: Neurology

## 2021-09-25 IMAGING — MR MR CERVICAL SPINE WO/W CM
5 of 8 series · 27 of 48 positions shown · IV contrast (20ml Multihance)
Comparison: Prior brain MRI from 01/25/2020.

CLINICAL DATA: Initial evaluation for dizziness, blurry vision,
numbness. Prior abnormal brain MRI, possible demyelinating disease.

EXAM:
MRI CERVICAL AND THORACIC SPINE WITHOUT AND WITH CONTRAST
TECHNIQUE: Multiplanar and multiecho pulse sequences of the cervical spine, to
include the craniocervical junction and cervicothoracic junction,
and the thoracic spine, were obtained without and with intravenous
contrast.
CONTRAST:  20mL MULTIHANCE GADOBENATE DIMEGLUMINE 529 MG/ML IV SOLN

[Series 3: T2 · sagittal · 3.0mm · 0.66mm/px · 4 of 15 slices shown (1 of 2)]
[im 1/15]
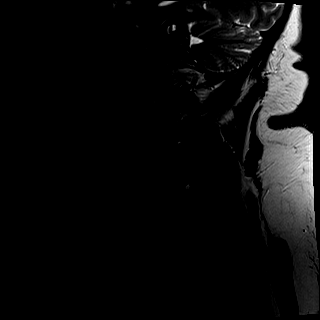
[im 5/15]
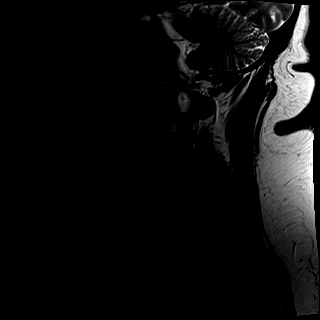
[im 10/15]
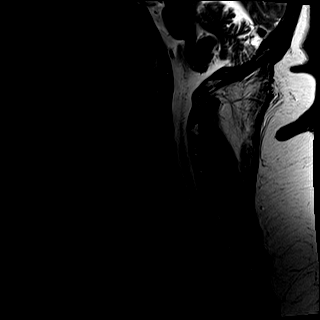
[im 15/15]
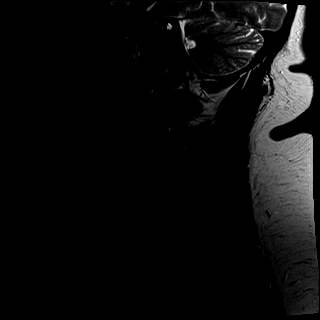

[Series 4: T1 · sagittal · 3.0mm · 0.41mm/px · 4 of 15 slices shown (1 of 2)]
[im 1/15]
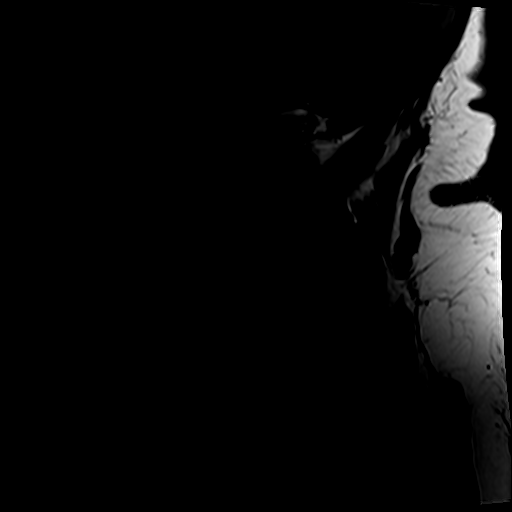
[im 5/15]
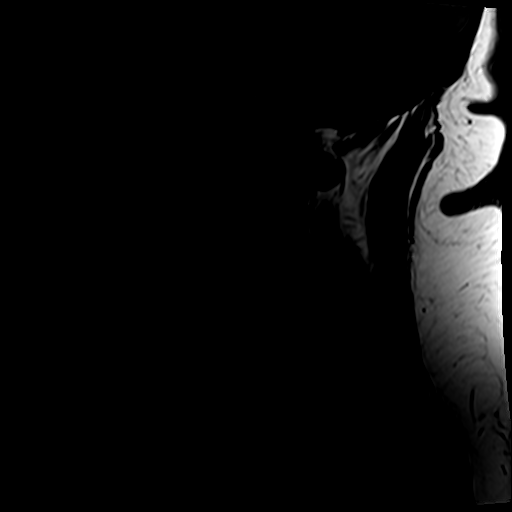
[im 10/15]
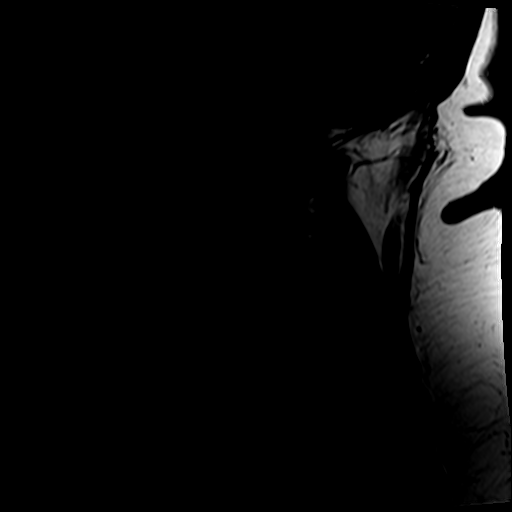
[im 15/15]
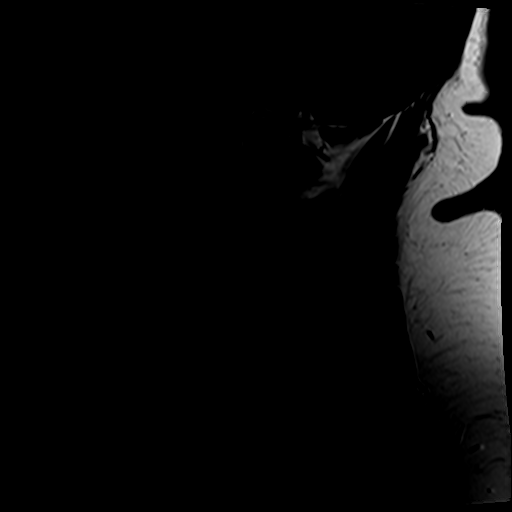

[Series 7: T2 · axial · 3.0mm · 0.70mm/px · z∈[-45,+55]mm · 8 of 28 slices shown (2 of 2)]
[im 1/28]
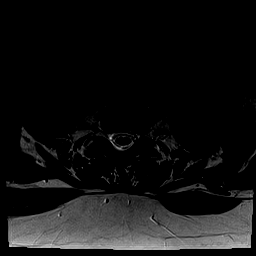
[im 4/28]
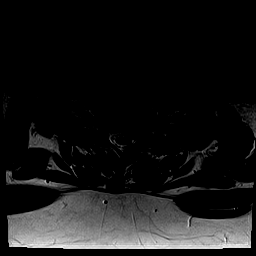
[im 8/28]
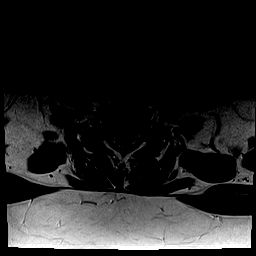
[im 12/28]
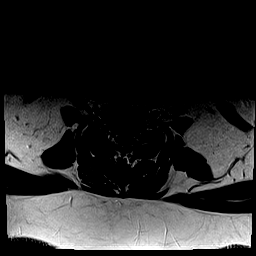
[im 16/28]
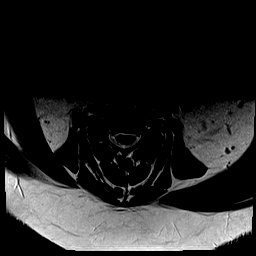
[im 20/28]
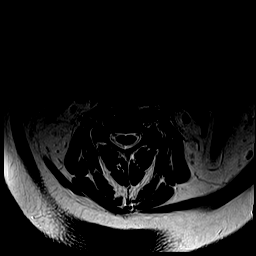
[im 24/28]
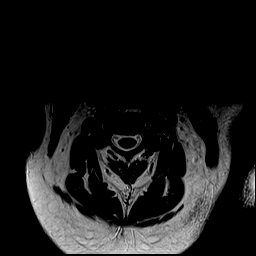
[im 28/28]
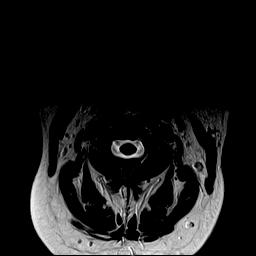

[Series 8: T1 · axial · 3.0mm · 0.35mm/px · z∈[-45,+55]mm · 8 of 28 slices shown (2 of 2)]
[im 1/28]
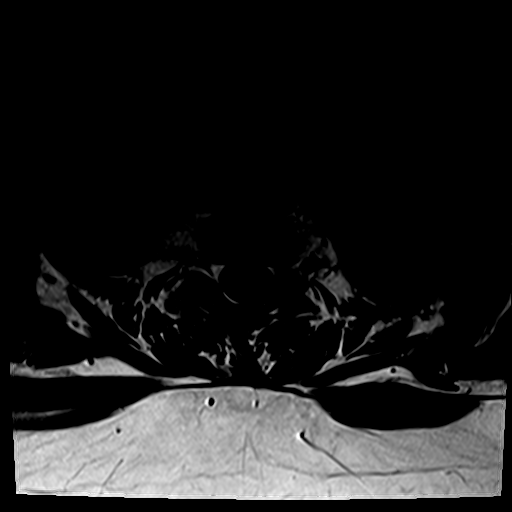
[im 4/28]
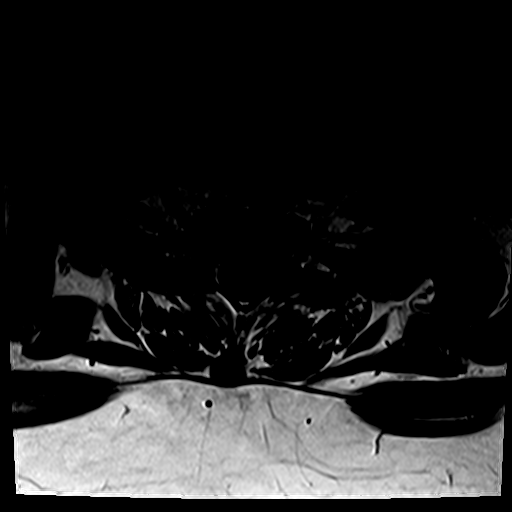
[im 8/28]
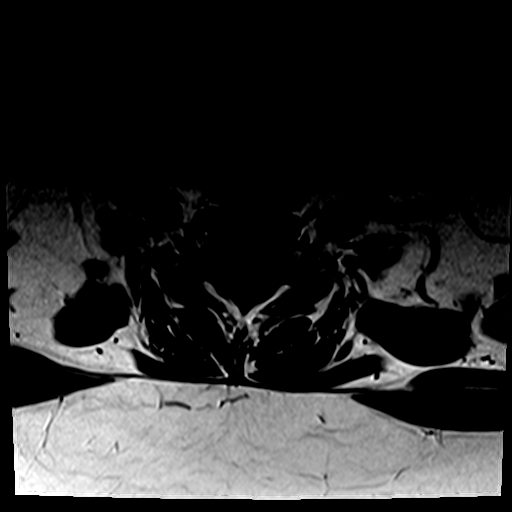
[im 12/28]
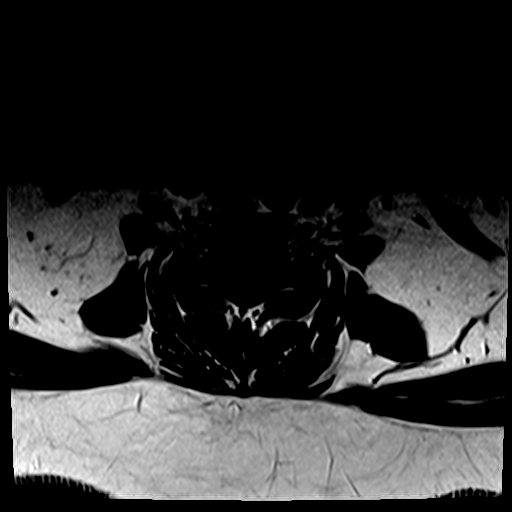
[im 16/28]
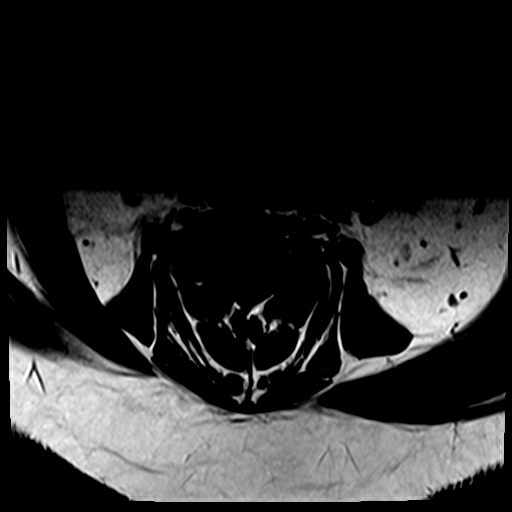
[im 20/28]
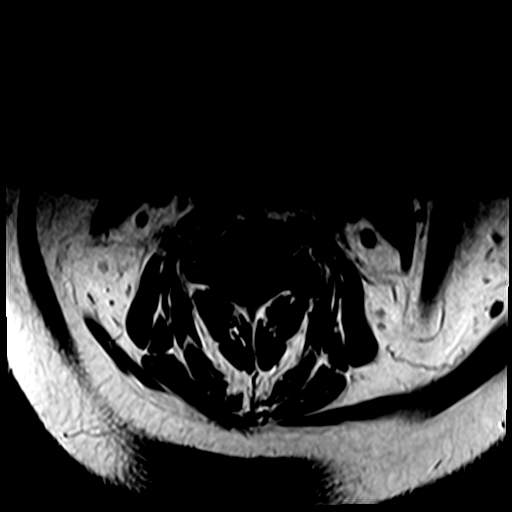
[im 24/28]
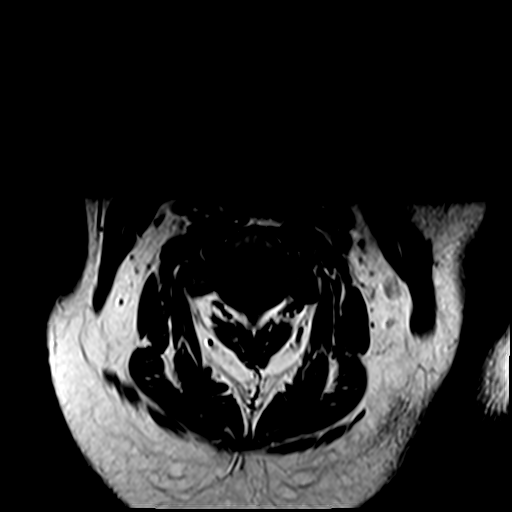
[im 28/28]
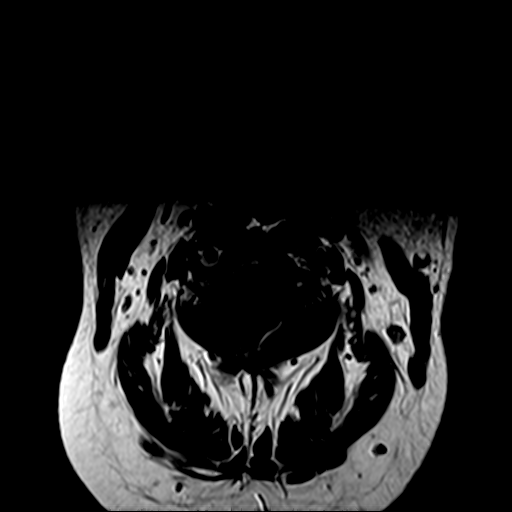

[Series 10: T1 fat-sat post-contrast · sagittal · 3.0mm · 0.82mm/px · 3 of 15 slices shown]
[im 1/15]
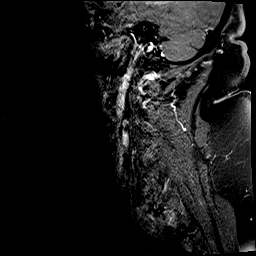
[im 5/15]
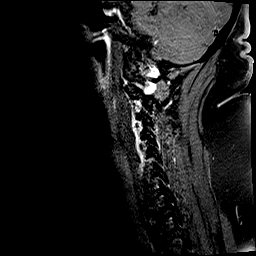
[im 10/15]
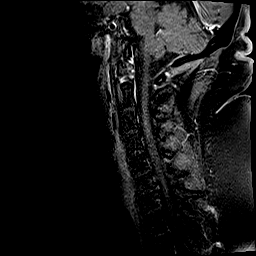

[27 of 48 positions shown; findings below may reference images not displayed]

FINDINGS: MRI CERVICAL SPINE FINDINGS

Alignment: Straightening of the normal cervical lordosis. No
listhesis or static subluxation.

Vertebrae: Vertebral body height well maintained without acute or
chronic fracture. Bone marrow signal intensity within normal limits.
No discrete or worrisome osseous lesions. No abnormal marrow edema
or enhancement.

Cord: Normal signal morphology. No cord signal changes to suggest
demyelinating disease here it no abnormal enhancement.

Paraspinal and other soft tissues: Craniocervical junction normal.
Paraspinous and prevertebral soft tissues within normal limits.
Normal flow voids seen within the vertebral arteries bilaterally.

Disc levels:

C2-C3: Minimal disc bulge with endplate spurring. No spinal
stenosis. Foramina remain patent.

C3-C4: Mild disc bulge with uncovertebral and endplate spurring. No
spinal stenosis. Foramina remain patent.

C4-C5: Mild disc bulge, slightly asymmetric to the right. Mild right
greater than left uncovertebral spurring. No spinal stenosis.
Foramina remain patent.

C5-C6: Left paracentral disc protrusion indents the left ventral
thecal sac (series 6, image 16). Mild flattening of the left hemi
cord without cord signal changes. Mild spinal stenosis. Foramina
remain patent.

C6-C7: Right eccentric disc osteophyte complex flattens and
partially effaces the ventral thecal sac, greater on the right. Mild
flattening of right hemicord without cord signal changes. Mild
spinal stenosis. Moderate right with mild left C7 foraminal
stenosis.

C7-T1: Minimal endplate spurring and disc bulge. No canal or
foraminal stenosis.

MRI THORACIC SPINE FINDINGS

Alignment: Mild dextroscoliosis. Alignment otherwise normal with
preservation of the normal thoracic kyphosis. No listhesis.

Vertebrae: Chronic endplate Schmorl's node deformity with associated
mild height loss noted at the superior endplate of T12. Otherwise
vertebral body height maintained. Bone marrow signal intensity
within normal limits. No worrisome osseous lesions. No abnormal
marrow edema or enhancement.

Cord: Normal signal and morphology. No cord signal changes to
suggest demyelinating disease. No abnormal enhancement.

Paraspinal and other soft tissues: Unremarkable.

Disc levels:

T1-2: Minimal left eccentric disc bulge.  No stenosis.

T2-3: Small central disc protrusion indents the ventral thecal sac.
No significant spinal stenosis. Foramina remain patent.

T5-6: Tiny left subarticular to foraminal disc protrusion minimally
indents the left ventral thecal sac. No significant stenosis.

T7-8: Small right paracentral disc protrusion indents the right
ventral thecal sac. Minimal cord flattening without cord signal
changes or significant stenosis (series 8, image 23).

No other significant disc pathology seen within the thoracic spine.
Mild multilevel facet degeneration. No significant spinal stenosis.
Foramina remain patent.
IMPRESSION: MRI CERVICAL SPINE IMPRESSION:

1. Normal MRI appearance of the cervical spinal cord. No cord signal
changes to suggest demyelinating disease. No abnormal enhancement.
2. Left paracentral disc protrusion at C5-6 with mild flattening of
the left hemi cord.
3. Right eccentric disc osteophyte complex at C6-7 with resultant
mild spinal stenosis with moderate right and C7 foraminal narrowing.

MRI THORACIC SPINE IMPRESSION:

1. Normal MRI appearance of the thoracic spinal cord. No cord signal
changes to suggest demyelinating disease. No abnormal enhancement.
2. Mild multilevel thoracic spondylosis with small disc protrusions
at T2-3 through T7-8 as above. No significant stenosis.

## 2021-12-12 ENCOUNTER — Ambulatory Visit: Payer: No Typology Code available for payment source | Admitting: Neurology

## 2022-02-04 NOTE — Progress Notes (Deleted)
? ?NEUROLOGY FOLLOW UP OFFICE NOTE ? ?Wanda Pratt ?[id number] ? ?Assessment/Plan:  ? ?Abnormal white matter on MRI of brain - appearance is concerning for possible demyelinating disease (multiple sclerosis).  CSF showed bands in the CSF although it was also in the serum, suggestive of a systemic etiology.  However, IgG index was elevated.  I would like to check MRI of cervical and thoracic spine with and without contrast to evaluate for evidence of demyelination. ?  ?Check SSA/SSB antibodies and pan-ANCA in case findings may be suggestive of a vasculitis ?MRI of cervical and thoracic spine with and without contrast - if there is evidence of plaques, then a diagnosis of MS is established.   ?Given blurred vision, will refer to ophthalmology ?If workup is unrevealing, would refer to an MS specialist ?Follow up after testing.   ?  ?Subjective:  ?Wanda Pratt is a 56 year old right-handed female with HTN, hypothyroidism, prediabetes, OSA, RLS, chronic nausea and Bipolar depression who follows up for second opinion regarding abnormal white matter changes on brain MRI ? ?UPDATE: ?For vasculitis workup, ANCA screen and SSA/SSB antibodies checked in July were normal.  MRI of cervical and thoracic spine with and without contrast on 06/22/2021 personally reviewed showed small disc protrusions with left paracentral disc protrusion at C5-6 with mild flattening of left hemicord but normal spinal cord.  She was referred to ophthalmology *** ? ?HISTORY:  ?She began experiencing dizziness and nausea around 2020.  Dizziness was described as a spinning or lightheadedness.  It would typically happen upon standing from a seated position and occur a couple of times a week.  In late January 2021, she was sitting in her recliner and when she got up, she felt dizzy and passed out, falling and sustaining a left distal fibula fracture.  She endorsed difficulty with balance and aching pain in the legs as well as paresthesias.  She  also reports blurred vision, muscle spasms and cramps.  She also endorses memory problems and fatigue.  NCV-EMG of the lower extremities on 02/22/2020 was normal.  Continued to have transient episodes of dizziness.  Labs at that time were unremarkable, including CBC, CMP, TSH, and vit D.  Echocardiogram was unremarkable with LVEF 60-65%.  She saw neurology.  Awake and asleep EEG on 01/16/2020 was normal.  Serum labs were unremarkable, including ANA, ESR, CRP, CK, TSH, T15, folic acid, Lyme, RPR, HIV, Lyme, vit D, CBC and CMP.  MRI of brain with and without contrast on 01/25/2020 showed multiple round and ovoid periventricular subcortical and pericallosal T2 hyperintensities, non-enhancing.  Her biological mother reportedly died of complications from multiple sclerosis when she was in her 47s.  She underwent lumbar puncture on 02/17/2020 which demonstrated CSF cell count 3, protein 30, glucose 79, negative myelin basic protein, negative gram stain, negative VDRL but did show mildly elevated IgG index 0.69 and over 5 bands in the CSF as well as serum.  She had a repeat MRI of the brain with and without contrast on 03/12/2021 which showed no acute changes and was stable compared to prior imaging from 01/25/2020.  Her prior neurologist felt that her weakness was related to deconditioning given her sedentary lifestyle.  She also reports arthritis in the knees. ? ?PAST MEDICAL HISTORY: ?Past Medical History:  ?Diagnosis Date  ? Bipolar depression ([place])   ? Chronic nausea   ? Dizziness   ? Fibula fracture   ? left  ? GERD (gastroesophageal reflux disease)   ? Hypertension   ?  Hypothyroidism   ? Insomnia   ? Obesity   ? OSA on CPAP   ? RLS (restless legs syndrome)   ? ? ?MEDICATIONS: ?Current Outpatient Medications on File Prior to Visit  ?Medication Sig Dispense Refill  ? ALPRAZolam (XANAX) 1 MG tablet Take 1-2 tablets 30 minutes before MRI, may take an additional before entering scanner if needed; MUST HAVE DRIVER 3 tablet 0  ?  ARMOUR THYROID PO Take by mouth as directed. Armour - Taking 142m Mon - Fri, taking 930mSat & Sun.    ? benazepril (LOTENSIN) 20 MG tablet Take 20 mg by mouth daily.    ? diazepam (VALIUM) 5 MG tablet Take 1 tablet 30-min prior to MRI.  Do not drive. 1 tablet 0  ? gabapentin (NEURONTIN) 300 MG capsule Take 300 mg by mouth at bedtime.    ? hydrOXYzine (VISTARIL) 50 MG capsule Take 50 mg by mouth in the morning, at noon, and at bedtime.     ? lithium carbonate 300 MG capsule Take 300 mg by mouth 2 (two) times daily.    ? loratadine (CLARITIN) 10 MG tablet Take 10 mg by mouth daily.    ? ondansetron (ZOFRAN) 4 MG tablet Take 4 mg by mouth 2 (two) times daily as needed for nausea or vomiting.     ? promethazine (PHENERGAN) 25 MG tablet Take 25 mg by mouth every 8 (eight) hours as needed for nausea/vomiting.    ? ranitidine (ZANTAC) 150 MG capsule Take 150 mg by mouth 2 (two) times daily. (Patient not taking: Reported on 06/05/2021)    ? traZODone (DESYREL) 100 MG tablet Take 200 mg by mouth at bedtime.    ? venlafaxine XR (EFFEXOR-XR) 150 MG 24 hr capsule Take 150 mg by mouth daily.    ? ?No current facility-administered medications on file prior to visit.  ? ? ?ALLERGIES: ?Allergies  ?Allergen Reactions  ? Penicillins Rash  ?  Has patient had a PCN reaction causing immediate rash, facial/tongue/throat swelling, SOB or lightheadedness with hypotension: Yes ?Has patient had a PCN reaction causing severe rash involving mucus membranes or skin necrosis: Unknown ?Has patient had a PCN reaction that required hospitalization: No ?Has patient had a PCN reaction occurring within the last 10 years: No ?If all of the above answers are "NO", then may proceed with Cephalosporin use. ?  ? ? ?FAMILY HISTORY: ?Family History  ?Adopted: Yes  ?Problem Relation Age of Onset  ? Multiple sclerosis Mother   ? Other Father   ?     died at young age from a truck accident  ? ? ?  ?Objective:  ?*** ?General: No acute distress.  Patient appears  ***-groomed.   ?Head:  Normocephalic/atraumatic ?Eyes:  Fundi examined but not visualized ?Neck: supple, no paraspinal tenderness, full range of motion ?Heart:  Regular rate and rhythm ?Lungs:  Clear to auscultation bilaterally ?Back: No paraspinal tenderness ?Neurological Exam: alert and oriented to person, place, and time.  Speech fluent and not dysarthric, language intact.  CN II-XII intact. Bulk and tone normal, muscle strength 5/5 throughout.  Sensation to light touch intact.  Deep tendon reflexes 2+ throughout, toes downgoing.  Finger to nose testing intact.  Gait normal, Romberg negative. ? ? ?AdMetta ClinesDO ? ?CC: *** ? ? ? ? ? ? ?

## 2022-02-05 ENCOUNTER — Ambulatory Visit: Payer: No Typology Code available for payment source | Admitting: Neurology

## 2022-04-17 ENCOUNTER — Other Ambulatory Visit: Payer: Self-pay | Admitting: Family Medicine

## 2022-04-17 DIAGNOSIS — Z1231 Encounter for screening mammogram for malignant neoplasm of breast: Secondary | ICD-10-CM

## 2022-05-02 ENCOUNTER — Ambulatory Visit: Payer: No Typology Code available for payment source

## 2022-05-02 ENCOUNTER — Ambulatory Visit
Admission: RE | Admit: 2022-05-02 | Discharge: 2022-05-02 | Disposition: A | Discharge status: Home / Self Care | Payer: No Typology Code available for payment source | Source: Ambulatory Visit | Attending: Family Medicine | Admitting: Family Medicine

## 2022-05-02 DIAGNOSIS — Z1231 Encounter for screening mammogram for malignant neoplasm of breast: Secondary | ICD-10-CM

## 2022-05-06 ENCOUNTER — Encounter: Payer: Self-pay | Admitting: Neurology

## 2022-05-06 NOTE — Progress Notes (Deleted)
NEUROLOGY FOLLOW UP OFFICE NOTE  Wanda Pratt [id number]  Assessment/Plan:   Abnormal white matter on brain MRI - appearance is concerning for possible demyelinating disease (multiple sclerosis).  CSF showed bands in the CSF although it was also in the serum, suggestive of a systemic etiology.  However, IgG index was elevated.  I would like to check MRI of cervical and thoracic spine with and without contrast to evaluate for evidence of demyelination.   Check SSA/SSB antibodies and pan-ANCA in case findings may be suggestive of a vasculitis MRI of cervical and thoracic spine with and without contrast - if there is evidence of plaques, then a diagnosis of MS is established.   Given blurred vision, will refer to ophthalmology If workup is unrevealing, would refer to an [place] specialist Follow up after testing.     Subjective:  Wanda Pratt is a 56 year old right-handed female with HTN, hypothyroidism, prediabetes, OSA, RLS, chronic nausea and Bipolar depression who follows up for abnormal white matter on brain MRI.  UPDATE: Sjogren's antibodies and ANCA panel on 06/05/2021 were negative.  MRI of cervical and thoracic spine with and without contrast on 06/22/2021 personally reviewed showed degenerative changes with left paracentral disc protrusion at C5-6 causing mild flattening of left hemicord but spinal cord looks normal.  As she endorsed blurred vision, I had referred her to ophthalmology ***  HISTORY: She began experiencing dizziness and nausea around 2020.  Dizziness was described as a spinning or lightheadedness.  It would typically happen upon standing from a seated position and occur a couple of times a week.  In late January 2021, she was sitting in her recliner and when she got up, she felt dizzy and passed out, falling and sustaining a left distal fibula fracture.  She endorsed difficulty with balance and aching pain in the legs as well as paresthesias.  She also reports blurred  vision, muscle spasms and cramps.  She also endorses memory problems and fatigue.  NCV-EMG of the lower extremities on 02/22/2020 was normal.  Continued to have transient episodes of dizziness.  Labs at that time were unremarkable, including CBC, CMP, TSH, and vit D.  Echocardiogram was unremarkable with LVEF 60-65%.  She saw neurology.  Awake and asleep EEG on 01/16/2020 was normal.  Serum labs were unremarkable, including ANA, ESR, CRP, CK, TSH, C94, folic acid, Lyme, RPR, HIV, Lyme, vit D, CBC and CMP.  MRI of brain with and without contrast on 01/25/2020 showed multiple round and ovoid periventricular subcortical and pericallosal T2 hyperintensities, non-enhancing.  Her biological mother reportedly died of complications from multiple sclerosis when she was in her 52s.  She underwent lumbar puncture on 02/17/2020 which demonstrated CSF cell count 3, protein 30, glucose 79, negative myelin basic protein, negative gram stain, negative VDRL but did show mildly elevated IgG index 0.69 and over 5 bands in the CSF as well as serum.  She had a repeat MRI of the brain with and without contrast on 03/12/2021 which showed no acute changes and was stable compared to prior imaging from 01/25/2020.  Her prior neurologist felt that her weakness was related to deconditioning given her sedentary lifestyle.  She also reports arthritis in the knees.    PAST MEDICAL HISTORY: Past Medical History:  Diagnosis Date   Bipolar depression ([place])    Chronic nausea    Dizziness    Fibula fracture    left   GERD (gastroesophageal reflux disease)    Hypertension  Hypothyroidism    Insomnia    Obesity    OSA on CPAP    RLS (restless legs syndrome)     MEDICATIONS: Current Outpatient Medications on File Prior to Visit  Medication Sig Dispense Refill   ALPRAZolam (XANAX) 1 MG tablet Take 1-2 tablets 30 minutes before MRI, may take an additional before entering scanner if needed; MUST HAVE DRIVER 3 tablet 0   ARMOUR THYROID PO  Take by mouth as directed. Armour - Taking 153m Mon - Fri, taking 921mSat & Sun.     benazepril (LOTENSIN) 20 MG tablet Take 20 mg by mouth daily.     diazepam (VALIUM) 5 MG tablet Take 1 tablet 30-min prior to MRI.  Do not drive. 1 tablet 0   gabapentin (NEURONTIN) 300 MG capsule Take 300 mg by mouth at bedtime.     hydrOXYzine (VISTARIL) 50 MG capsule Take 50 mg by mouth in the morning, at noon, and at bedtime.      lithium carbonate 300 MG capsule Take 300 mg by mouth 2 (two) times daily.     loratadine (CLARITIN) 10 MG tablet Take 10 mg by mouth daily.     ondansetron (ZOFRAN) 4 MG tablet Take 4 mg by mouth 2 (two) times daily as needed for nausea or vomiting.      promethazine (PHENERGAN) 25 MG tablet Take 25 mg by mouth every 8 (eight) hours as needed for nausea/vomiting.     ranitidine (ZANTAC) 150 MG capsule Take 150 mg by mouth 2 (two) times daily. (Patient not taking: Reported on 06/05/2021)     traZODone (DESYREL) 100 MG tablet Take 200 mg by mouth at bedtime.     venlafaxine XR (EFFEXOR-XR) 150 MG 24 hr capsule Take 150 mg by mouth daily.     No current facility-administered medications on file prior to visit.    ALLERGIES: Allergies  Allergen Reactions   Penicillins Rash    Has patient had a PCN reaction causing immediate rash, facial/tongue/throat swelling, SOB or lightheadedness with hypotension: Yes Has patient had a PCN reaction causing severe rash involving mucus membranes or skin necrosis: Unknown Has patient had a PCN reaction that required hospitalization: No Has patient had a PCN reaction occurring within the last 10 years: No If all of the above answers are "NO", then may proceed with Cephalosporin use.     FAMILY HISTORY: Family History  Adopted: Yes  Problem Relation Age of Onset   Multiple sclerosis Mother    Other Father        died at young age from a truck accident      Objective:  *** General: No acute distress.  Patient appears ***-groomed.    Head:  Normocephalic/atraumatic Eyes:  Fundi examined but not visualized Neck: supple, no paraspinal tenderness, full range of motion Heart:  Regular rate and rhythm Lungs:  Clear to auscultation bilaterally Back: No paraspinal tenderness Neurological Exam: alert and oriented to person, place, and time.  Speech fluent and not dysarthric, language intact.  CN II-XII intact. Bulk and tone normal, muscle strength 5/5 throughout.  Sensation to light touch intact.  Deep tendon reflexes 2+ throughout, toes downgoing.  Finger to nose testing intact.  Gait normal, Romberg negative.   AdMetta ClinesDO  CC: AnJillyn LedgerFNP

## 2022-05-07 ENCOUNTER — Ambulatory Visit: Payer: No Typology Code available for payment source | Admitting: Neurology

## 2022-05-07 NOTE — Progress Notes (Unsigned)
NEUROLOGY FOLLOW UP OFFICE NOTE  Wanda Pratt [id number]  Assessment/Plan:   Abnormal white matter on brain MRI - appearance is concerning for possible demyelinating disease (multiple sclerosis).  CSF showed bands in the CSF although it was also in the serum, suggestive of a systemic etiology.  However, IgG index was elevated.  I would like to check MRI of cervical and thoracic spine with and without contrast to evaluate for evidence of demyelination.   Check SSA/SSB antibodies and pan-ANCA in case findings may be suggestive of a vasculitis MRI of cervical and thoracic spine with and without contrast - if there is evidence of plaques, then a diagnosis of MS is established.   Given blurred vision, will refer to ophthalmology If workup is unrevealing, would refer to an [place] specialist Follow up after testing.     Subjective:  Wanda Pratt is a 56 year old right-handed female with HTN, hypothyroidism, prediabetes, OSA, RLS, chronic nausea and Bipolar depression who follows up for abnormal white matter on brain MRI.  UPDATE: Sjogren's antibodies and ANCA panel on 06/05/2021 were negative.  MRI of cervical and thoracic spine with and without contrast on 06/22/2021 personally reviewed showed degenerative changes with left paracentral disc protrusion at C5-6 causing mild flattening of left hemicord but spinal cord looks normal.  As she endorsed blurred vision, I had referred her to ophthalmology ***  HISTORY: She began experiencing dizziness and nausea around 2020.  Dizziness was described as a spinning or lightheadedness.  It would typically happen upon standing from a seated position and occur a couple of times a week.  In late January 2021, she was sitting in her recliner and when she got up, she felt dizzy and passed out, falling and sustaining a left distal fibula fracture.  She endorsed difficulty with balance and aching pain in the legs as well as paresthesias.  She also reports blurred  vision, muscle spasms and cramps.  She also endorses memory problems and fatigue.  NCV-EMG of the lower extremities on 02/22/2020 was normal.  Continued to have transient episodes of dizziness.  Labs at that time were unremarkable, including CBC, CMP, TSH, and vit D.  Echocardiogram was unremarkable with LVEF 60-65%.  She saw neurology.  Awake and asleep EEG on 01/16/2020 was normal.  Serum labs were unremarkable, including ANA, ESR, CRP, CK, TSH, C94, folic acid, Lyme, RPR, HIV, Lyme, vit D, CBC and CMP.  MRI of brain with and without contrast on 01/25/2020 showed multiple round and ovoid periventricular subcortical and pericallosal T2 hyperintensities, non-enhancing.  Her biological mother reportedly died of complications from multiple sclerosis when she was in her 52s.  She underwent lumbar puncture on 02/17/2020 which demonstrated CSF cell count 3, protein 30, glucose 79, negative myelin basic protein, negative gram stain, negative VDRL but did show mildly elevated IgG index 0.69 and over 5 bands in the CSF as well as serum.  She had a repeat MRI of the brain with and without contrast on 03/12/2021 which showed no acute changes and was stable compared to prior imaging from 01/25/2020.  Her prior neurologist felt that her weakness was related to deconditioning given her sedentary lifestyle.  She also reports arthritis in the knees.    PAST MEDICAL HISTORY: Past Medical History:  Diagnosis Date   Bipolar depression ([place])    Chronic nausea    Dizziness    Fibula fracture    left   GERD (gastroesophageal reflux disease)    Hypertension  Hypothyroidism    Insomnia    Obesity    OSA on CPAP    RLS (restless legs syndrome)     MEDICATIONS: Current Outpatient Medications on File Prior to Visit  Medication Sig Dispense Refill   ALPRAZolam (XANAX) 1 MG tablet Take 1-2 tablets 30 minutes before MRI, may take an additional before entering scanner if needed; MUST HAVE DRIVER 3 tablet 0   ARMOUR THYROID PO  Take by mouth as directed. Armour - Taking $Remove'180mg'NWUnOYt$  Mon - Fri, taking $RemoveBef'90mg'AFHlSVqTyS$  Sat & Sun.     benazepril (LOTENSIN) 20 MG tablet Take 20 mg by mouth daily.     diazepam (VALIUM) 5 MG tablet Take 1 tablet 30-min prior to MRI.  Do not drive. 1 tablet 0   gabapentin (NEURONTIN) 300 MG capsule Take 300 mg by mouth at bedtime.     hydrOXYzine (VISTARIL) 50 MG capsule Take 50 mg by mouth in the morning, at noon, and at bedtime.      lithium carbonate 300 MG capsule Take 300 mg by mouth 2 (two) times daily.     loratadine (CLARITIN) 10 MG tablet Take 10 mg by mouth daily.     ondansetron (ZOFRAN) 4 MG tablet Take 4 mg by mouth 2 (two) times daily as needed for nausea or vomiting.      promethazine (PHENERGAN) 25 MG tablet Take 25 mg by mouth every 8 (eight) hours as needed for nausea/vomiting.     ranitidine (ZANTAC) 150 MG capsule Take 150 mg by mouth 2 (two) times daily. (Patient not taking: Reported on 06/05/2021)     traZODone (DESYREL) 100 MG tablet Take 200 mg by mouth at bedtime.     venlafaxine XR (EFFEXOR-XR) 150 MG 24 hr capsule Take 150 mg by mouth daily.     No current facility-administered medications on file prior to visit.    ALLERGIES: Allergies  Allergen Reactions   Penicillins Rash    Has patient had a PCN reaction causing immediate rash, facial/tongue/throat swelling, SOB or lightheadedness with hypotension: Yes Has patient had a PCN reaction causing severe rash involving mucus membranes or skin necrosis: Unknown Has patient had a PCN reaction that required hospitalization: No Has patient had a PCN reaction occurring within the last 10 years: No If all of the above answers are "NO", then may proceed with Cephalosporin use.     FAMILY HISTORY: Family History  Adopted: Yes  Problem Relation Age of Onset   Multiple sclerosis Mother    Other Father        died at young age from a truck accident      Objective:  *** General: No acute distress.  Patient appears ***-groomed.    Head:  Normocephalic/atraumatic Eyes:  Fundi examined but not visualized Neck: supple, no paraspinal tenderness, full range of motion Heart:  Regular rate and rhythm Lungs:  Clear to auscultation bilaterally Back: No paraspinal tenderness Neurological Exam: alert and oriented to person, place, and time.  Speech fluent and not dysarthric, language intact.  CN II-XII intact. Bulk and tone normal, muscle strength 5/5 throughout.  Sensation to light touch intact.  Deep tendon reflexes 2+ throughout, toes downgoing.  Finger to nose testing intact.  Gait normal, Romberg negative.   Metta Clines, DO  CC: Jillyn Ledger, FNP

## 2022-05-08 ENCOUNTER — Ambulatory Visit (INDEPENDENT_AMBULATORY_CARE_PROVIDER_SITE_OTHER): Payer: No Typology Code available for payment source | Admitting: Neurology

## 2022-05-08 ENCOUNTER — Encounter: Payer: Self-pay | Admitting: Neurology

## 2022-05-08 VITALS — BP 114/76 | HR 72 | Ht 65.0 in | Wt 309.6 lb

## 2022-05-08 DIAGNOSIS — R9082 White matter disease, unspecified: Secondary | ICD-10-CM | POA: Diagnosis not present

## 2022-05-08 DIAGNOSIS — R531 Weakness: Secondary | ICD-10-CM | POA: Diagnosis not present

## 2022-05-08 NOTE — Patient Instructions (Signed)
I would like to refer you to the MS clinic at [facility] to see if they think you have MS or MRI findings are nonspecific and of unknown clinical significance.  Follow up with me afterwards.

## 2022-06-20 DIAGNOSIS — G250 Essential tremor: Secondary | ICD-10-CM | POA: Diagnosis not present

## 2022-06-20 DIAGNOSIS — G43001 Migraine without aura, not intractable, with status migrainosus: Secondary | ICD-10-CM | POA: Diagnosis not present

## 2022-06-20 DIAGNOSIS — R42 Dizziness and giddiness: Secondary | ICD-10-CM | POA: Diagnosis not present

## 2022-06-20 DIAGNOSIS — M316 Other giant cell arteritis: Secondary | ICD-10-CM | POA: Diagnosis not present

## 2022-06-20 DIAGNOSIS — G43909 Migraine, unspecified, not intractable, without status migrainosus: Secondary | ICD-10-CM | POA: Diagnosis not present

## 2022-06-20 DIAGNOSIS — G35 Multiple sclerosis: Secondary | ICD-10-CM | POA: Diagnosis not present

## 2022-06-20 DIAGNOSIS — R531 Weakness: Secondary | ICD-10-CM | POA: Diagnosis not present

## 2022-06-20 DIAGNOSIS — R9082 White matter disease, unspecified: Secondary | ICD-10-CM | POA: Diagnosis not present

## 2022-06-30 NOTE — Progress Notes (Unsigned)
NEUROLOGY FOLLOW UP OFFICE NOTE  Wanda Pratt [id number]  Assessment/Plan:   Multiple sclerosis Daily headaches.  Probably migraine given severity of pain with phonophobia as well as photophobia, otherwise features are consistent with tension type headache.  Labs revealed elevated sed rate of 59 and CRP of 14.8.  I do not suspect temporal arteritis as headaches are not consistent with GCA and she denies visual disturbance.   Essential tremor  She would like to continue care with me Plan to start Zeposia for DMT: check VZV titer, EKG and eye exam Repeat MRI of brain with and without contrast to establish new baseline pre-treatment Continue propranolol 20mg  three times daily for tremor.  May also potentially help migraines.  If no improvement in headaches in 4 weeks, she will contact me.  Given her history of Bipolar disorder, would avoid prescribing an antidepressant for migraine prevention. Check vit D level. Follow up in 6 months.    Subjective:  Wanda Pratt is a 56 year old right-handed female with HTN, hypothyroidism, prediabetes, OSA, RLS, chronic nausea and Bipolar depression who follows up for abnormal white matter on brain MRI.  UPDATE: Current medications:  gabapentin 300mg  BID, propranolol 20mg  TID, hydroxyzine, lithium  She started having new headaches 2 months ago.  They are a moderate-severe bifrontal pressure headache lasting 2-3 hours and occurring daily.  Associated with photophobia and sometimes phonophobia but no nausea, vomiting, visual disturbance, numbness or weakness.  Sometimes treats with ibuprofen but not often and usually lays down to rest..  Denies prior history of headaches.  Also has had tremors in the hands off and on for years which returned a couple of months ago as well.    She was seen by neurology at [facility] for second opinion.  MS was suspected.  She endorsed to the neurologist a daily bifrontal pressure headache with nausea and  photophobia lasting 3-4 hours.  No vision loss.  For intractable headache, prescribed a Medrol dose pack and started on propranolol for presumed migraines.  ESR and CRP were 59 and 14.8 respectively.  Started on propranolol for tremor.  Headaches returned after finishing the steroid taper.  Tremors improved on propranolol.  Labs from 8/11 revealed CBC with WBC 6.2, HGB 12.6, HCT 38.3, PLT 217, ALC 1.4; CMP with Na 137, K 4.7, Cl 104, CO2 26, BUN 14, glucose 170, BUN 14, Cr 0.90, t bili 0.3, ALP 76, AST 16, ALT 15; Quantiferon-TB Gold Plus negative.  HISTORY: She began experiencing dizziness and nausea around 2020.  Dizziness was described as a spinning or lightheadedness.  It would typically happen upon standing from a seated position and occur a couple of times a week.  In late January 2021, she was sitting in her recliner and when she got up, she felt dizzy and passed out, falling and sustaining a left distal fibula fracture.  She endorsed difficulty with balance and aching pain in the legs as well as paresthesias.  She also reports blurred vision, muscle spasms and cramps.  She also endorses memory problems and fatigue.  NCV-EMG of the lower extremities on 02/22/2020 was normal.  Continued to have transient episodes of dizziness.  Labs at that time were unremarkable, including CBC, CMP, TSH, and vit D.  Echocardiogram was unremarkable with LVEF 60-65%.  She saw neurology.  Awake and asleep EEG on 01/16/2020 was normal.  Serum labs were unremarkable, including ANA, ESR, CRP, CK, TSH, K59, folic acid, Lyme, RPR, HIV, Lyme, vit D, CBC  and CMP.  MRI of brain with and without contrast on 01/25/2020 showed multiple round and ovoid periventricular subcortical and pericallosal T2 hyperintensities, non-enhancing.  Her biological mother reportedly died of complications from multiple sclerosis when she was in her 35s.  She underwent lumbar puncture on 02/17/2020 which demonstrated CSF cell count 3, protein 30, glucose 79,  negative myelin basic protein, negative gram stain, negative VDRL but did show mildly elevated IgG index 0.69 and over 5 bands in the CSF as well as serum.  She had a repeat MRI of the brain with and without contrast on 03/12/2021 which showed no acute changes and was stable compared to prior imaging from 01/25/2020.  Her prior neurologist felt that her weakness was related to deconditioning given her sedentary lifestyle.  She also reports arthritis in the knees.  Sjogren's antibodies and ANCA panel on 06/05/2021 were negative.  MRI of cervical and thoracic spine with and without contrast on 06/22/2021 personally reviewed showed degenerative changes with left paracentral disc protrusion at C5-6 causing mild flattening of left hemicord but spinal cord looks normal.  As she endorsed blurred vision, I had referred her to ophthalmology.  Found to have astigmatism in both eyes and mild glaucoma but otherwise unremarkable.     PAST MEDICAL HISTORY: Past Medical History:  Diagnosis Date   Bipolar depression ([place])    Chronic nausea    Dizziness    Fibula fracture    left   GERD (gastroesophageal reflux disease)    Hypertension    Hypothyroidism    Insomnia    Obesity    OSA on CPAP    RLS (restless legs syndrome)     MEDICATIONS: Current Outpatient Medications on File Prior to Visit  Medication Sig Dispense Refill   ARMOUR THYROID PO Take by mouth as directed. Armour - Taking $Remove'180mg'JHkUFVr$  Mon - Fri, taking $RemoveBef'90mg'nepIvwzIth$  Sat & Sun.     benazepril (LOTENSIN) 20 MG tablet Take 20 mg by mouth daily.     gabapentin (NEURONTIN) 300 MG capsule Take 300 mg by mouth at bedtime.     hydrOXYzine (VISTARIL) 50 MG capsule Take 50 mg by mouth in the morning, at noon, and at bedtime.      lithium carbonate 300 MG capsule Take 300 mg by mouth 2 (two) times daily.     loratadine (CLARITIN) 10 MG tablet Take 10 mg by mouth daily.     ondansetron (ZOFRAN) 4 MG tablet Take 4 mg by mouth 2 (two) times daily as needed for nausea or  vomiting.      promethazine (PHENERGAN) 25 MG tablet Take 25 mg by mouth every 8 (eight) hours as needed for nausea/vomiting.     ranitidine (ZANTAC) 150 MG capsule Take 150 mg by mouth 2 (two) times daily.     traZODone (DESYREL) 100 MG tablet Take 200 mg by mouth at bedtime.     venlafaxine XR (EFFEXOR-XR) 150 MG 24 hr capsule Take 150 mg by mouth daily.     No current facility-administered medications on file prior to visit.    ALLERGIES: Allergies  Allergen Reactions   Penicillins Rash    Has patient had a PCN reaction causing immediate rash, facial/tongue/throat swelling, SOB or lightheadedness with hypotension: Yes Has patient had a PCN reaction causing severe rash involving mucus membranes or skin necrosis: Unknown Has patient had a PCN reaction that required hospitalization: No Has patient had a PCN reaction occurring within the last 10 years: No If all of the above answers  are "NO", then may proceed with Cephalosporin use.     FAMILY HISTORY: Family History  Adopted: Yes  Problem Relation Age of Onset   Multiple sclerosis Mother    Other Father        died at young age from a truck accident      Objective:  Blood pressure 109/75, pulse 79, height $RemoveBe'5\' 6"'hzktQQJpw$  (1.676 m), weight (!) 314 lb 3.2 oz (142.5 kg), SpO2 94 %. General: No acute distress.  Patient appears well-groomed.    Metta Clines, DO  CC: Jillyn Ledger, FNP

## 2022-07-01 ENCOUNTER — Telehealth: Payer: Self-pay

## 2022-07-01 ENCOUNTER — Ambulatory Visit: Payer: BC Managed Care – PPO | Admitting: Neurology

## 2022-07-01 ENCOUNTER — Other Ambulatory Visit (HOSPITAL_COMMUNITY): Payer: Self-pay

## 2022-07-01 ENCOUNTER — Telehealth (HOSPITAL_COMMUNITY): Payer: Self-pay | Admitting: Pharmacy Technician

## 2022-07-01 ENCOUNTER — Encounter: Payer: Self-pay | Admitting: Neurology

## 2022-07-01 VITALS — BP 109/75 | HR 79 | Ht 66.0 in | Wt 314.2 lb

## 2022-07-01 DIAGNOSIS — G35 Multiple sclerosis: Secondary | ICD-10-CM

## 2022-07-01 DIAGNOSIS — G250 Essential tremor: Secondary | ICD-10-CM | POA: Diagnosis not present

## 2022-07-01 DIAGNOSIS — G43009 Migraine without aura, not intractable, without status migrainosus: Secondary | ICD-10-CM | POA: Diagnosis not present

## 2022-07-01 NOTE — Patient Instructions (Addendum)
Plan to start Zeposia for MS control: Check VZV titer and vitamin D EKG Eye exam Pending results, will start medication.   Continue propranolol 20mg  three times daily for headache control and tremors.  If headaches not improved in 4 weeks contact me Repeat MRI of brain with and without contrast Follow up 6 months.

## 2022-07-01 NOTE — Telephone Encounter (Signed)
Patient seen in office today. Dr.Jaffe would like to start Patient on Zeposia.  PA Team please start a PA for this medication. And we will need the Specialty pharmacy.

## 2022-07-01 NOTE — Telephone Encounter (Signed)
Patient Advocate Encounter   Received notification that prior authorization for Zeposia Starter Kit 0.23MG &0.46MG0.92MG(21) is required.   PA submitted on 07/01/2022 Key [id number] Status is pending       Lyndel Safe, [place] Patient Advocate Specialist [place] Patient Advocate Team Direct Number: [phone]  Fax: [phone]

## 2022-07-03 ENCOUNTER — Other Ambulatory Visit (HOSPITAL_COMMUNITY): Payer: Self-pay

## 2022-07-03 NOTE — Telephone Encounter (Signed)
Patient Advocate Encounter  Received notification that the request for prior authorization for Zeposia Starter Kit 0.$RemoveBefor'23MG'gOMIbBqMSiLj$  &0.'46MG'$ 0.$RemoveB'92MG'eMWPVdRG$ (21) has been denied due to The member must have tried one of the following medications and it did not work, or the member must have a medical reason why they cannot take all of the following medications:  dimethyl fumarate (generic Tecfidera), glatiramer acetate (generic Copaxon or Glatopa), fingolimod (generic Gilenya), terflunimode (generic Aubagio).    Or the member has been treated with three different MS medications.     Lyndel Safe, [place] Patient Advocate Specialist [place] Patient Advocate Team Direct Number: [phone]  Fax: [phone]

## 2022-07-04 ENCOUNTER — Telehealth (HOSPITAL_COMMUNITY): Payer: Self-pay | Admitting: Pharmacy Technician

## 2022-07-04 ENCOUNTER — Other Ambulatory Visit (HOSPITAL_COMMUNITY): Payer: Self-pay

## 2022-07-04 NOTE — Telephone Encounter (Signed)
Patient Advocate Encounter   Received notification that prior authorization for Teriflunomide 14MG  tablets is required.   PA submitted on 07/04/2022 Key [id number] Status is pending       07/06/2022, [facility] Patient Advocate Specialist [facility] Patient Advocate Team Direct Number: [phone]  Fax: [phone]

## 2022-07-07 ENCOUNTER — Other Ambulatory Visit: Payer: Self-pay | Admitting: Neurology

## 2022-07-07 MED ORDER — TERIFLUNOMIDE 14 MG PO TABS: 14.0000 mg | ORAL_TABLET | Freq: Every day | ORAL | 5 refills | Status: DC

## 2022-07-07 NOTE — Telephone Encounter (Signed)
done

## 2022-07-07 NOTE — Telephone Encounter (Signed)
Patient Advocate Encounter  Prior Authorization for Teriflunomide 14MG  tablets has been approved.     Effective dates: 07/04/2022 through 07/04/2023      07/06/2023, [facility] Pharmacy Patient Advocate Specialist [facility] Patient Advocate Team Direct Number: [phone]  Fax: [phone]

## 2022-07-08 ENCOUNTER — Encounter: Payer: Self-pay | Admitting: Neurology

## 2022-07-09 NOTE — Telephone Encounter (Signed)
Telephone call and mychart message to patient. Dr.Jaffe sent in  terflunimode (generic Aubagio due to the denial of Zeposia.

## 2022-07-16 DIAGNOSIS — R42 Dizziness and giddiness: Secondary | ICD-10-CM | POA: Diagnosis not present

## 2022-07-16 DIAGNOSIS — Z20822 Contact with and (suspected) exposure to covid-19: Secondary | ICD-10-CM | POA: Diagnosis not present

## 2022-07-16 DIAGNOSIS — J029 Acute pharyngitis, unspecified: Secondary | ICD-10-CM | POA: Diagnosis not present

## 2022-07-16 DIAGNOSIS — G35 Multiple sclerosis: Secondary | ICD-10-CM | POA: Diagnosis not present

## 2022-07-16 DIAGNOSIS — K580 Irritable bowel syndrome with diarrhea: Secondary | ICD-10-CM | POA: Diagnosis not present

## 2022-07-17 ENCOUNTER — Telehealth: Payer: Self-pay | Admitting: Neurology

## 2022-07-17 ENCOUNTER — Other Ambulatory Visit: Payer: Self-pay | Admitting: Neurology

## 2022-07-17 MED ORDER — DIAZEPAM 5 MG PO TABS: ORAL_TABLET | ORAL | 0 refills | Status: DC

## 2022-07-17 NOTE — Telephone Encounter (Signed)
Called patient to let her know left voicemail. 

## 2022-07-17 NOTE — Telephone Encounter (Signed)
MRI Saturday at [facility]  She is claustrophobic and requests something be sent in.  [facility] [place]

## 2022-07-18 NOTE — Telephone Encounter (Signed)
Called patient and left message.

## 2022-07-19 ENCOUNTER — Ambulatory Visit
Admission: RE | Admit: 2022-07-19 | Discharge: 2022-07-19 | Disposition: A | Discharge status: Home / Self Care | Payer: BC Managed Care – PPO | Source: Ambulatory Visit | Attending: Neurology | Admitting: Neurology

## 2022-07-19 DIAGNOSIS — R479 Unspecified speech disturbances: Secondary | ICD-10-CM | POA: Diagnosis not present

## 2022-07-19 DIAGNOSIS — R410 Disorientation, unspecified: Secondary | ICD-10-CM | POA: Diagnosis not present

## 2022-07-19 DIAGNOSIS — G35 Multiple sclerosis: Secondary | ICD-10-CM | POA: Diagnosis not present

## 2022-07-19 MED ORDER — GADOBENATE DIMEGLUMINE 529 MG/ML IV SOLN
20.0000 mL | Freq: Once | INTRAVENOUS | Status: AC | PRN
  Administered 2022-07-19: 20 mL via INTRAVENOUS

## 2022-08-01 ENCOUNTER — Telehealth: Payer: Self-pay | Admitting: Neurology

## 2022-08-01 NOTE — Telephone Encounter (Signed)
Pt called asking for a referral to [facility] center the referral has to come from a [facility] doctor. I told her this may have to come from her PCP she stated that her PCP is not [facility]

## 2022-08-01 NOTE — Telephone Encounter (Signed)
Patient left a message requesting a referral but not other details included.

## 2022-08-01 NOTE — Telephone Encounter (Signed)
Pt called no answer per DPR left a voice mail that Dr Tomi Likens will not be able to that referral for her, she will need to have her PCP do it,

## 2022-09-05 DIAGNOSIS — F419 Anxiety disorder, unspecified: Secondary | ICD-10-CM | POA: Diagnosis not present

## 2022-09-05 DIAGNOSIS — G2401 Drug induced subacute dyskinesia: Secondary | ICD-10-CM | POA: Diagnosis not present

## 2022-09-05 DIAGNOSIS — F3175 Bipolar disorder, in partial remission, most recent episode depressed: Secondary | ICD-10-CM | POA: Diagnosis not present

## 2022-09-05 DIAGNOSIS — Z79899 Other long term (current) drug therapy: Secondary | ICD-10-CM | POA: Diagnosis not present

## 2022-09-26 DIAGNOSIS — G35 Multiple sclerosis: Secondary | ICD-10-CM | POA: Diagnosis not present

## 2022-09-26 DIAGNOSIS — G43001 Migraine without aura, not intractable, with status migrainosus: Secondary | ICD-10-CM | POA: Diagnosis not present

## 2022-09-26 DIAGNOSIS — R42 Dizziness and giddiness: Secondary | ICD-10-CM | POA: Diagnosis not present

## 2022-09-26 DIAGNOSIS — G250 Essential tremor: Secondary | ICD-10-CM | POA: Diagnosis not present

## 2022-12-31 NOTE — Progress Notes (Unsigned)
NEUROLOGY FOLLOW UP OFFICE NOTE  Wanda Pratt [id number]  Assessment/Plan:   Multiple sclerosis Chronic migraine without aura, without status migrainosus, not intractable  Essential tremor    DMT:  Zeposia Repeat MRI of brain with and without contrast to establish new baseline pre-treatment Tremor/Migraines:  Propranolol 45m three times daily for tremor.  Given her history of Bipolar disorder, would avoid prescribing an antidepressant for migraine prevention. Check vit D level. Follow up in 6 months.     Subjective:  Wanda ROGALSKIis a 57year old right-handed female with HTN, hypothyroidism, prediabetes, OSA, RLS, chronic nausea and Bipolar depression who follows up for multiple sclerosis.  MRI of brain from September personally reviewed.   UPDATE: DMT:  teriflunomide. (started ***) Current medications:  gabapentin 3043mBID, propranolol 3014mID, hydroxyzine, lithium  07/19/2022 MRI BRAIN W WO:  Stable examination since 03/12/2021. Cerebral hemispheric white matter lesions consistent with the clinical diagnosis of multiple sclerosis. No new or progressive lesions. No lesions show restricted diffusion or contrast enhancement.  Insurance denied Zeposia.  Instead, she was started on teriflunomide.  [facility].  They increased propranolol to 46m91mree times daily.     Vision:  *** Motor:  *** Sensory:  *** Pain:  *** Gait:  *** Bowel/Bladder:  *** Fatigue:  *** Cognition:  *** Mood:  *** Tremor:  *** Headache:  ***   HISTORY: She began experiencing dizziness and nausea around 2020.  Dizziness was described as a spinning or lightheadedness.  It would typically happen upon standing from a seated position and occur a couple of times a week.  In late January 2021, she was sitting in her recliner and when she got up, she felt dizzy and passed out,  falling and sustaining a left distal fibula fracture.  She endorsed difficulty with balance and aching pain in the legs as well as paresthesias.  She also reports blurred vision, muscle spasms and cramps.  She also endorses memory problems and fatigue.  NCV-EMG of the lower extremities on 02/22/2020 was normal.  Continued to have transient episodes of dizziness.  Labs at that time were unremarkable, including CBC, CMP, TSH, and vit D.  Echocardiogram was unremarkable with LVEF 60-65%.  She saw neurology.  Awake and asleep EEG on 01/16/2020 was normal.  Serum labs were unremarkable, including ANA, ESR, CRP, CK, TSH, B12,123456lic acid, Lyme, RPR, HIV, Lyme, vit D, CBC and CMP.  MRI of brain with and without contrast on 01/25/2020 showed multiple round and ovoid periventricular subcortical and pericallosal T2 hyperintensities, non-enhancing.  Her biological mother reportedly died of complications from multiple sclerosis when she was in her 40s.76she underwent lumbar puncture on 02/17/2020 which demonstrated CSF cell count 3, protein 30, glucose 79, negative myelin basic protein, negative gram stain, negative VDRL but did show mildly elevated IgG index 0.69 and over 5 bands in the CSF as well as serum.  She had a repeat MRI of the brain with and without contrast on 03/12/2021 which showed no acute changes and was stable compared to prior imaging from 01/25/2020.  Her prior neurologist felt that her weakness was related to deconditioning given her sedentary lifestyle.  She also reports arthritis in the knees.  Sjogren's antibodies and ANCA panel on 06/05/2021 were negative.  MRI of cervical and thoracic spine with and without contrast on 06/22/2021 personally reviewed showed degenerative changes  with left paracentral disc protrusion at C5-6 causing mild flattening of left hemicord but spinal cord looks normal.  As she endorsed blurred vision, I had referred her to ophthalmology.  Found to have astigmatism in both eyes and mild  glaucoma but otherwise unremarkable.   She started having new headaches in June 2023.  They are a moderate-severe bifrontal pressure headache lasting 2-3 hours and occurring daily.  Associated with photophobia and sometimes phonophobia but no nausea, vomiting, visual disturbance, numbness or weakness.  Sometimes treats with ibuprofen but not often and usually lays down to rest..  Denies prior history of headaches.  She had labs performed by another provider.  ESR and CRP were 59 and 14.8.  GCA not suspected as semiology not consistent.     Also has had tremors in the hands off and on for years which returned a couple of months ago as well.    PAST MEDICAL HISTORY: Past Medical History:  Diagnosis Date   Bipolar depression ([place])    Chronic nausea    Dizziness    Fibula fracture    left   GERD (gastroesophageal reflux disease)    Hypertension    Hypothyroidism    Insomnia    Obesity    OSA on CPAP    RLS (restless legs syndrome)     MEDICATIONS: Current Outpatient Medications on File Prior to Visit  Medication Sig Dispense Refill   ARMOUR THYROID PO Take by mouth as directed. Armour - Taking 128m Mon - Fri, taking 910mSat & Sun.     benazepril (LOTENSIN) 20 MG tablet Take 20 mg by mouth daily.     diazepam (VALIUM) 5 MG tablet Take 1 tablet 30-40 minutes prior to MRI 2 tablet 0   famotidine (PEPCID) 20 MG tablet Take 20 mg by mouth 2 (two) times daily.     gabapentin (NEURONTIN) 300 MG capsule Take 300 mg by mouth at bedtime.     hydrOXYzine (VISTARIL) 50 MG capsule Take 50 mg by mouth in the morning, at noon, and at bedtime.      lithium carbonate 300 MG capsule Take 300 mg by mouth 2 (two) times daily.     loratadine (CLARITIN) 10 MG tablet Take 10 mg by mouth daily.     ondansetron (ZOFRAN) 4 MG tablet Take 4 mg by mouth 2 (two) times daily as needed for nausea or vomiting.      phentermine 37.5 MG capsule 1 capsule     pravastatin (PRAVACHOL) 10 MG tablet Take 10 mg by mouth  daily.     promethazine (PHENERGAN) 25 MG tablet Take 25 mg by mouth every 8 (eight) hours as needed for nausea/vomiting.     propranolol (INDERAL) 10 MG tablet Take 20 mg by mouth 3 (three) times daily.     ranitidine (ZANTAC) 150 MG capsule Take 150 mg by mouth 2 (two) times daily.     RYBELSUS 3 MG TABS Take 1 tablet by mouth daily.     Teriflunomide 14 MG TABS Take 14 mg by mouth daily. 30 tablet 5   traZODone (DESYREL) 100 MG tablet Take 200 mg by mouth at bedtime.     venlafaxine XR (EFFEXOR-XR) 150 MG 24 hr capsule Take 150 mg by mouth daily.     No current facility-administered medications on file prior to visit.    ALLERGIES: Allergies  Allergen Reactions   Penicillins Rash    Has patient had a PCN reaction causing immediate rash, facial/tongue/throat swelling, SOB or  lightheadedness with hypotension: Yes Has patient had a PCN reaction causing severe rash involving mucus membranes or skin necrosis: Unknown Has patient had a PCN reaction that required hospitalization: No Has patient had a PCN reaction occurring within the last 10 years: No If all of the above answers are "NO", then may proceed with Cephalosporin use.     FAMILY HISTORY: Family History  Adopted: Yes  Problem Relation Age of Onset   Multiple sclerosis Mother    Other Father        died at young age from a truck accident      Objective:  *** General: No acute distress.  Patient appears ***-groomed.   Head:  Normocephalic/atraumatic Eyes:  Fundi examined but not visualized Neck: supple, no paraspinal tenderness, full range of motion Heart:  Regular rate and rhythm Lungs:  Clear to auscultation bilaterally Back: No paraspinal tenderness Neurological Exam: alert and oriented to person, place, and time.  Speech fluent and not dysarthric, language intact.  CN II-XII intact. Bulk and tone normal, muscle strength 5/5 throughout.  Sensation to light touch intact.  Deep tendon reflexes 2+ throughout, toes  downgoing.  Finger to nose testing intact.  Gait normal, Romberg negative.   Metta Clines, DO  CC: ***

## 2023-01-01 ENCOUNTER — Other Ambulatory Visit (INDEPENDENT_AMBULATORY_CARE_PROVIDER_SITE_OTHER): Payer: Self-pay

## 2023-01-01 ENCOUNTER — Encounter: Payer: Self-pay | Admitting: Neurology

## 2023-01-01 ENCOUNTER — Ambulatory Visit: Payer: Self-pay | Admitting: Neurology

## 2023-01-01 VITALS — BP 121/77 | HR 87 | Ht 65.0 in | Wt 297.1 lb

## 2023-01-01 DIAGNOSIS — G250 Essential tremor: Secondary | ICD-10-CM

## 2023-01-01 DIAGNOSIS — G43009 Migraine without aura, not intractable, without status migrainosus: Secondary | ICD-10-CM

## 2023-01-01 DIAGNOSIS — G35 Multiple sclerosis: Secondary | ICD-10-CM

## 2023-01-01 LAB — CBC WITH DIFFERENTIAL/PLATELET
Basophils Absolute: 0.1 10*3/uL (ref 0.0–0.1)
Basophils Relative: 1.3 % (ref 0.0–3.0)
Eosinophils Absolute: 0.3 10*3/uL (ref 0.0–0.7)
Eosinophils Relative: 4.7 % (ref 0.0–5.0)
HCT: 42.5 % (ref 36.0–46.0)
Hemoglobin: 13.9 g/dL (ref 12.0–15.0)
Lymphocytes Relative: 21.5 % (ref 12.0–46.0)
Lymphs Abs: 1.5 10*3/uL (ref 0.7–4.0)
MCHC: 32.7 g/dL (ref 30.0–36.0)
MCV: 86.5 fl (ref 78.0–100.0)
Monocytes Absolute: 0.6 10*3/uL (ref 0.1–1.0)
Monocytes Relative: 8.1 % (ref 3.0–12.0)
Neutro Abs: 4.4 10*3/uL (ref 1.4–7.7)
Neutrophils Relative %: 64.4 % (ref 43.0–77.0)
Platelets: 283 10*3/uL (ref 150.0–400.0)
RBC: 4.92 Mil/uL (ref 3.87–5.11)
RDW: 13.5 % (ref 11.5–15.5)
WBC: 6.9 10*3/uL (ref 4.0–10.5)

## 2023-01-01 LAB — HEPATIC FUNCTION PANEL
ALT: 19 U/L (ref 0–35)
AST: 20 U/L (ref 0–37)
Albumin: 4.4 g/dL (ref 3.5–5.2)
Alkaline Phosphatase: 82 U/L (ref 39–117)
Bilirubin, Direct: 0.1 mg/dL (ref 0.0–0.3)
Total Bilirubin: 0.4 mg/dL (ref 0.2–1.2)
Total Protein: 8.3 g/dL (ref 6.0–8.3)

## 2023-01-01 LAB — VITAMIN D 25 HYDROXY (VIT D DEFICIENCY, FRACTURES): VITD: 33.59 ng/mL (ref 30.00–100.00)

## 2023-01-01 MED ORDER — PROPRANOLOL HCL 10 MG PO TABS
30.0000 mg | ORAL_TABLET | Freq: Three times a day (TID) | ORAL | 5 refills | Status: DC
Start: 2023-01-01 — End: 2023-07-07

## 2023-01-01 MED ORDER — ONDANSETRON HCL 8 MG PO TABS: 8.0000 mg | ORAL_TABLET | Freq: Three times a day (TID) | ORAL | 5 refills | Status: AC | PRN

## 2023-01-01 NOTE — Patient Instructions (Addendum)
Continue teriflunomide 42m daily Continue propranolol 3960mthree times daily Ondansetron 60m33ms needed for nausea Repeat MRI of brain with and without contrast in 6 months. We have [facility] a [facility] and they will call you directly to schedule your appointment. They are located at [address] you need to contact them directly please call [phone] labs today and again in 6 months:  CBC with diff, hepatic panel, vit D. Your provider has requested that you have labwork completed today. [id number] on the second floor of this building before leaving the office today. You do not need to check in. If you are not called within 15 minutes please check with the front desk.   Follow up in 6 months (after repeat tests)

## 2023-02-10 ENCOUNTER — Telehealth: Payer: Self-pay | Admitting: Anesthesiology

## 2023-02-10 NOTE — Telephone Encounter (Signed)
Pt called back in stating she believes it is a manufacturer issue due to her getting it through [employer] order.

## 2023-02-10 NOTE — Telephone Encounter (Signed)
Pt left message with AN stating her pharmacy is out of stock on her MS medication. Requests call back.

## 2023-02-10 NOTE — Telephone Encounter (Signed)
LMOVM if the patient was advised it is a Paediatric nurse out of stock or Designer, fashion/clothing issue?

## 2023-02-12 NOTE — Telephone Encounter (Signed)
Spoke to [employer] per rep they tried running the script through her insurance with a message reply of Insurance Termed Please contact Patient.  Advised patient to please give [employer] a call to give new policy number and if she still has an issue please contact the office.

## 2023-02-12 NOTE — Telephone Encounter (Signed)
Pt called back in to double check on what she should do about the manufacturer issue with her medicine.

## 2023-02-13 NOTE — Telephone Encounter (Signed)
LMOVM to check to make sure everything was settled and the patient will be able to get her medication.

## 2023-03-03 ENCOUNTER — Telehealth: Payer: Self-pay | Admitting: Neurology

## 2023-03-03 MED ORDER — TERIFLUNOMIDE 14 MG PO TABS: 14.0000 mg | ORAL_TABLET | Freq: Every day | ORAL | 5 refills | Status: DC

## 2023-03-03 NOTE — Telephone Encounter (Signed)
Refill sent.

## 2023-03-03 NOTE — Telephone Encounter (Signed)
New message    1. Which medications need to be refilled? (please list name of each medication and dose if known) periflunomide   2. Which pharmacy/location (including street and city if local pharmacy) is medication to be sent to?[facility] [phone]   3. Do they need a 30 day or 90 day supply? 30 day supply

## 2023-03-17 ENCOUNTER — Telehealth: Payer: Self-pay | Admitting: Anesthesiology

## 2023-03-17 NOTE — Telephone Encounter (Signed)
Pt called stating her Teriflunomide needs a PA before she can get it filled.

## 2023-03-19 ENCOUNTER — Telehealth: Payer: Self-pay

## 2023-03-19 ENCOUNTER — Other Ambulatory Visit (HOSPITAL_COMMUNITY): Payer: Self-pay

## 2023-03-19 NOTE — Telephone Encounter (Signed)
PA submitted and will be updated in additional encounter created. 

## 2023-03-19 NOTE — Telephone Encounter (Signed)
PA request received via provider for Teriflunomide 14MG  tablets  PA has been submitted to [facility] and is pending determination  Key: [id number]

## 2023-03-26 DIAGNOSIS — N183 Chronic kidney disease, stage 3 unspecified: Secondary | ICD-10-CM | POA: Diagnosis not present

## 2023-03-26 DIAGNOSIS — F319 Bipolar disorder, unspecified: Secondary | ICD-10-CM | POA: Diagnosis not present

## 2023-03-26 DIAGNOSIS — E559 Vitamin D deficiency, unspecified: Secondary | ICD-10-CM | POA: Diagnosis not present

## 2023-03-26 DIAGNOSIS — I10 Essential (primary) hypertension: Secondary | ICD-10-CM | POA: Diagnosis not present

## 2023-03-26 DIAGNOSIS — G35 Multiple sclerosis: Secondary | ICD-10-CM | POA: Diagnosis not present

## 2023-03-26 DIAGNOSIS — E1169 Type 2 diabetes mellitus with other specified complication: Secondary | ICD-10-CM | POA: Diagnosis not present

## 2023-03-26 DIAGNOSIS — Z0000 Encounter for general adult medical examination without abnormal findings: Secondary | ICD-10-CM | POA: Diagnosis not present

## 2023-03-26 DIAGNOSIS — E039 Hypothyroidism, unspecified: Secondary | ICD-10-CM | POA: Diagnosis not present

## 2023-03-30 NOTE — Telephone Encounter (Signed)
PA has been APPROVED from 03/19/2023-03/17/2024

## 2023-04-07 DIAGNOSIS — Z79899 Other long term (current) drug therapy: Secondary | ICD-10-CM | POA: Diagnosis not present

## 2023-04-07 DIAGNOSIS — F419 Anxiety disorder, unspecified: Secondary | ICD-10-CM | POA: Diagnosis not present

## 2023-04-07 DIAGNOSIS — F3160 Bipolar disorder, current episode mixed, unspecified: Secondary | ICD-10-CM | POA: Diagnosis not present

## 2023-04-07 DIAGNOSIS — G4700 Insomnia, unspecified: Secondary | ICD-10-CM | POA: Diagnosis not present

## 2023-04-08 DIAGNOSIS — Z79899 Other long term (current) drug therapy: Secondary | ICD-10-CM | POA: Diagnosis not present

## 2023-06-08 DIAGNOSIS — M5441 Lumbago with sciatica, right side: Secondary | ICD-10-CM | POA: Diagnosis not present

## 2023-06-22 ENCOUNTER — Encounter: Payer: Self-pay | Admitting: Neurology

## 2023-06-30 ENCOUNTER — Other Ambulatory Visit: Payer: Self-pay | Admitting: Neurology

## 2023-06-30 ENCOUNTER — Telehealth: Payer: Self-pay | Admitting: Neurology

## 2023-06-30 MED ORDER — DIAZEPAM 5 MG PO TABS: ORAL_TABLET | ORAL | 0 refills | Status: DC

## 2023-06-30 NOTE — Telephone Encounter (Signed)
Patient is requesting medication to help while she is getting a MRI done on Thursday , and is also wanting to know the PA for the MRI has gone through

## 2023-07-01 NOTE — Progress Notes (Unsigned)
NEUROLOGY FOLLOW UP OFFICE NOTE  Wanda Pratt [id number]  Assessment/Plan:   Multiple sclerosis Chronic migraine without aura, without status migrainosus, not intractable  Essential tremor    DMT:   teriflunomide 14mg  daily Tremor/Migraines:  Propranolol 30mg  three times daily for tremor.  Given her history of Bipolar disorder, would avoid prescribing an antidepressant for migraine prevention. Increase ondansetron to 8mg  PRN Repeat MRI of brain with and without contrast in one year Check labs today and in 6 months:  CBC with diff, hepatic panel, and vit D. Follow up in 6 months (after repeat testing)      Subjective:  Wanda Pratt is a 57 year old right-handed female with HTN, hypothyroidism, prediabetes, OSA, RLS, chronic nausea and Bipolar depression who follows up for multiple sclerosis.  MRI of brain from September personally reviewed.   UPDATE: DMT:  teriflunomide.  Current medications:  gabapentin 300mg  BID, propranolol 30mg  TID, ondansetron 8mg , hydroxyzine, lithium  She had an MRI of the brain with and without contrast performed today.  Not formally read yet, but I personally reviewed it and it appears stable.  Tremor:  improved on propranolol Headache:  improved on propranolol Nausea - chronic.   Feels tired but otherwise feeling okay.     HISTORY: She began experiencing dizziness and nausea around 2020.  Dizziness was described as a spinning or lightheadedness.  It would typically happen upon standing from a seated position and occur a couple of times a week.  In late January 2021, she was sitting in her recliner and when she got up, she felt dizzy and passed out, falling and sustaining a left distal fibula fracture.  She endorsed difficulty with balance and aching pain in the legs as well as paresthesias.  She also reports blurred vision, muscle spasms and cramps.  She also endorses memory problems and fatigue.  NCV-EMG of the lower extremities on 02/22/2020  was normal.  Continued to have transient episodes of dizziness.  Labs at that time were unremarkable, including CBC, CMP, TSH, and vit D.  Echocardiogram was unremarkable with LVEF 60-65%.  She saw neurology.  Awake and asleep EEG on 01/16/2020 was normal.  Serum labs were unremarkable, including ANA, ESR, CRP, CK, TSH, B12, folic acid, Lyme, RPR, HIV, Lyme, vit D, CBC and CMP.  MRI of brain with and without contrast on 01/25/2020 showed multiple round and ovoid periventricular subcortical and pericallosal T2 hyperintensities, non-enhancing.  Her biological mother reportedly died of complications from multiple sclerosis when she was in her 58s.  She underwent lumbar puncture on 02/17/2020 which demonstrated CSF cell count 3, protein 30, glucose 79, negative myelin basic protein, negative gram stain, negative VDRL but did show mildly elevated IgG index 0.69 and over 5 bands in the CSF as well as serum.  She had a repeat MRI of the brain with and without contrast on 03/12/2021 which showed no acute changes and was stable compared to prior imaging from 01/25/2020.  Her prior neurologist felt that her weakness was related to deconditioning given her sedentary lifestyle.  She also reports arthritis in the knees.  Sjogren's antibodies and ANCA panel on 06/05/2021 were negative.  MRI of cervical and thoracic spine with and without contrast on 06/22/2021 personally reviewed showed degenerative changes with left paracentral disc protrusion at C5-6 causing mild flattening of left hemicord but spinal cord looks normal.  As she endorsed blurred vision, I had referred her to ophthalmology.  Found to have astigmatism in both eyes and mild  glaucoma but otherwise unremarkable.   She started having new headaches in June 2023.  They are a moderate-severe bifrontal pressure headache lasting 2-3 hours and occurring daily.  Associated with photophobia and sometimes phonophobia but no nausea, vomiting, visual disturbance, numbness or weakness.   Sometimes treats with ibuprofen but not often and usually lays down to rest..  Denies prior history of headaches.  She had labs performed by another provider.  ESR and CRP were 59 and 14.8.  GCA not suspected as semiology not consistent.     Also has had tremors in the hands off and on for years which returned a couple of months ago as well.    Imaging: 07/19/2022 MRI BRAIN W WO:  Stable examination since 03/12/2021. Cerebral hemispheric white matter lesions consistent with the clinical diagnosis of multiple sclerosis. No new or progressive lesions. No lesions show restricted diffusion or contrast enhancement. 06/22/2021 MRI C-SPINE W WO:  1. Normal MRI appearance of the cervical spinal cord. No cord signal changes to suggest demyelinating disease. No abnormal enhancement. 2. Left paracentral disc protrusion at C5-6 with mild flattening of the left hemi cord. 3. Right eccentric disc osteophyte complex at C6-7 with resultant mild spinal stenosis with moderate right and C7 foraminal narrowing.  06/22/2021 MRI T-SPINE W WO:  1. Normal MRI appearance of the thoracic spinal cord. No cord signal changes to suggest demyelinating disease. No abnormal enhancement. 2. Mild multilevel thoracic spondylosis with small disc protrusions at T2-3 through T7-8 as above. No significant stenosis 03/12/2021 MRI BRAIN W WO:  Abnormal MRI scan of the brain showing scattered mostly supratentorial periventricular, subcortical and pericallosal white matter hyperintensities in the pattern and distribution which may be compatible with demyelinating disease. No enhancing lesions are noted. Overall no significant change compared with previous MRI from 01/25/2020.  01/25/2020 MRI BRAIN WO:  - Multiple round and ovoid periventricular, subcortical, pericallosal T2 hyperintensities. Considerations include demyelinating, autoimmune, inflammatory, post-infectious or  microvascular ischemic etiologies. - No acute findings.  PAST MEDICAL  HISTORY: Past Medical History:  Diagnosis Date   Bipolar depression ([facility])    Chronic nausea    Dizziness    Fibula fracture    left   GERD (gastroesophageal reflux disease)    Hypertension    Hypothyroidism    Insomnia    Obesity    OSA on CPAP    RLS (restless legs syndrome)     MEDICATIONS: Current Outpatient Medications on File Prior to Visit  Medication Sig Dispense Refill   ARMOUR THYROID PO Take by mouth as directed. Armour - Taking 180mg  Mon - Fri, taking 90mg  Sat & Sun.     benazepril (LOTENSIN) 20 MG tablet Take 20 mg by mouth daily.     diazepam (VALIUM) 5 MG tablet Take 1 tablet 30-40 minutes prior to MRI 2 tablet 0   famotidine (PEPCID) 20 MG tablet Take 20 mg by mouth 2 (two) times daily.     gabapentin (NEURONTIN) 300 MG capsule Take 300 mg by mouth at bedtime.     hydrochlorothiazide (MICROZIDE) 12.5 MG capsule Take 1 capsule by mouth 2 (two) times daily.     hydrOXYzine (VISTARIL) 50 MG capsule Take 50 mg by mouth in the morning, at noon, and at bedtime.      levothyroxine (SYNTHROID) 150 MCG tablet Take 150 mcg by mouth daily before breakfast.     lithium carbonate 300 MG capsule Take 300 mg by mouth 2 (two) times daily.     loratadine (CLARITIN) 10  MG tablet Take 10 mg by mouth daily.     ondansetron (ZOFRAN) 8 MG tablet Take 1 tablet (8 mg total) by mouth every 8 (eight) hours as needed for nausea or vomiting. 30 tablet 5   pravastatin (PRAVACHOL) 10 MG tablet Take 10 mg by mouth daily.     promethazine (PHENERGAN) 25 MG tablet Take 25 mg by mouth every 8 (eight) hours as needed for nausea/vomiting.     propranolol (INDERAL) 10 MG tablet Take 3 tablets (30 mg total) by mouth 3 (three) times daily. 270 tablet 5   Semaglutide 7 MG TABS Take 1 tablet by mouth daily.     Teriflunomide 14 MG TABS Take 1 tablet (14 mg total) by mouth daily. 30 tablet 5   traZODone (DESYREL) 100 MG tablet Take 200 mg by mouth at bedtime.     venlafaxine XR (EFFEXOR-XR) 150 MG 24 hr  capsule Take 150 mg by mouth daily.     No current facility-administered medications on file prior to visit.    ALLERGIES: Allergies  Allergen Reactions   Penicillins Rash    Has patient had a PCN reaction causing immediate rash, facial/tongue/throat swelling, SOB or lightheadedness with hypotension: Yes Has patient had a PCN reaction causing severe rash involving mucus membranes or skin necrosis: Unknown Has patient had a PCN reaction that required hospitalization: No Has patient had a PCN reaction occurring within the last 10 years: No If all of the above answers are "NO", then may proceed with Cephalosporin use.     FAMILY HISTORY: Family History  Adopted: Yes  Problem Relation Age of Onset   Multiple sclerosis Mother    Other Father        died at young age from a truck accident      Objective:  Blood pressure 112/77, pulse 76, height 5\' 5"  (1.651 m), weight 283 lb (128.4 kg), SpO2 96%.  General: No acute distress.  Patient appears well-groomed.   Head:  Normocephalic/atraumatic Eyes:  Fundi examined but not visualized Neck: supple, no paraspinal tenderness, full range of motion Heart:  Regular rate and rhythm Neurological Exam: alert and oriented.  Speech fluent and not dysarthric, language intact.  CN II-XII intact. Bulk and tone normal, muscle strength 5/5 throughout.  Sensation to light touch intact.  Deep tendon reflexes 2+ throughout.  Finger to nose testing intact.  Mildly wide-based and cautious gait.  Ambulates with cane.  Romberg negative.   Shon Millet, DO  CC: Peri Maris, FNP

## 2023-07-01 NOTE — Telephone Encounter (Signed)
LMOVM for patient, Diazepam has been sent to the [facility] on [address].  Will need driver to and from the MRI

## 2023-07-02 ENCOUNTER — Ambulatory Visit: Payer: BLUE CROSS/BLUE SHIELD | Admitting: Neurology

## 2023-07-02 ENCOUNTER — Ambulatory Visit
Admission: RE | Admit: 2023-07-02 | Discharge: 2023-07-02 | Disposition: A | Discharge status: Home / Self Care | Payer: BLUE CROSS/BLUE SHIELD | Source: Ambulatory Visit | Attending: Neurology | Admitting: Neurology

## 2023-07-02 VITALS — BP 112/77 | HR 76 | Ht 65.0 in | Wt 283.0 lb

## 2023-07-02 DIAGNOSIS — G43009 Migraine without aura, not intractable, without status migrainosus: Secondary | ICD-10-CM

## 2023-07-02 DIAGNOSIS — G35 Multiple sclerosis: Secondary | ICD-10-CM | POA: Diagnosis not present

## 2023-07-02 DIAGNOSIS — G250 Essential tremor: Secondary | ICD-10-CM

## 2023-07-02 MED ORDER — GADOPICLENOL 0.5 MMOL/ML IV SOLN
10.0000 mL | Freq: Once | INTRAVENOUS | Status: AC | PRN
  Administered 2023-07-02: 10 mL via INTRAVENOUS

## 2023-07-02 NOTE — Patient Instructions (Addendum)
Teriflunomide 14mg  daily Propranolol 30mg  three times daily Check labs today and again in 6 months (CBC with diff, LFTs and vit D) Follow up 6 months.

## 2023-07-06 ENCOUNTER — Other Ambulatory Visit: Payer: Self-pay | Admitting: Neurology

## 2023-09-03 ENCOUNTER — Other Ambulatory Visit: Payer: Self-pay

## 2023-09-03 MED ORDER — TERIFLUNOMIDE 14 MG PO TABS: 14.0000 mg | ORAL_TABLET | Freq: Every day | ORAL | 5 refills | Status: AC

## 2023-09-23 ENCOUNTER — Other Ambulatory Visit: Payer: Self-pay | Admitting: Family Medicine

## 2023-09-23 DIAGNOSIS — Z1231 Encounter for screening mammogram for malignant neoplasm of breast: Secondary | ICD-10-CM

## 2023-10-06 DIAGNOSIS — Z23 Encounter for immunization: Secondary | ICD-10-CM | POA: Diagnosis not present

## 2023-10-06 DIAGNOSIS — E1169 Type 2 diabetes mellitus with other specified complication: Secondary | ICD-10-CM | POA: Diagnosis not present

## 2023-10-06 DIAGNOSIS — M545 Low back pain, unspecified: Secondary | ICD-10-CM | POA: Diagnosis not present

## 2023-10-06 DIAGNOSIS — I10 Essential (primary) hypertension: Secondary | ICD-10-CM | POA: Diagnosis not present

## 2023-10-06 DIAGNOSIS — E039 Hypothyroidism, unspecified: Secondary | ICD-10-CM | POA: Diagnosis not present

## 2023-10-27 ENCOUNTER — Ambulatory Visit
Admission: RE | Admit: 2023-10-27 | Discharge: 2023-10-27 | Disposition: A | Discharge status: Home / Self Care | Payer: BLUE CROSS/BLUE SHIELD | Source: Ambulatory Visit | Attending: Family Medicine | Admitting: Family Medicine

## 2023-10-27 DIAGNOSIS — Z1231 Encounter for screening mammogram for malignant neoplasm of breast: Secondary | ICD-10-CM | POA: Diagnosis not present

## 2023-12-31 NOTE — Progress Notes (Signed)
 NEUROLOGY FOLLOW UP OFFICE NOTE  Wanda Pratt [id number]  Assessment/Plan:   Multiple sclerosis Migraine without aura, without status migrainosus, not intractable  Essential tremor    DMT:   teriflunomide 14mg  daily Tremor/Migraines:  Propranolol 30mg  three times daily for tremor.  Given her history of Bipolar disorder, would avoid prescribing an antidepressant for migraine prevention. Increase ondansetron to 8mg  PRN Repeat MRI of brain with and without contrast in 6 months Check labs today: CBC with diff, hepatic panel Follow up in 6 months (after repeat testing)      Subjective:  Wanda Pratt is a 58 year old right-handed female with HTN, hypothyroidism, prediabetes, OSA, RLS, chronic nausea and Bipolar depression who follows up for multiple sclerosis, tremor and migraines.   UPDATE: DMT:  teriflunomide.  Current medications:  gabapentin 300mg  BID, propranolol 30mg  TID, ondansetron 8mg , hydroxyzine, lithium  Her husband unexpectedly passed away a couple of months ago.  She has family support.    From a physical standpoint, she has been doing well.   Tremor:  improved on propranolol Headache:  improved on propranolol Nausea - chronic.   Vertigo - occurs once a month.  Woke up Sunday morning and lasted all day.      HISTORY: She began experiencing dizziness and nausea around 2020.  Dizziness was described as a spinning or lightheadedness.  It would typically happen upon standing from a seated position and occur a couple of times a week.  In late January 2021, she was sitting in her recliner and when she got up, she felt dizzy and passed out, falling and sustaining a left distal fibula fracture.  She endorsed difficulty with balance and aching pain in the legs as well as paresthesias.  She also reports blurred vision, muscle spasms and cramps.  She also endorses memory problems and fatigue.  NCV-EMG of the lower extremities on 02/22/2020 was normal.  Continued to have  transient episodes of dizziness.  Labs at that time were unremarkable, including CBC, CMP, TSH, and vit D.  Echocardiogram was unremarkable with LVEF 60-65%.  She saw neurology.  Awake and asleep EEG on 01/16/2020 was normal.  Serum labs were unremarkable, including ANA, ESR, CRP, CK, TSH, B12, folic acid, Lyme, RPR, HIV, Lyme, vit D, CBC and CMP.  MRI of brain with and without contrast on 01/25/2020 showed multiple round and ovoid periventricular subcortical and pericallosal T2 hyperintensities, non-enhancing.  Her biological mother reportedly died of complications from multiple sclerosis when she was in her 38s.  She underwent lumbar puncture on 02/17/2020 which demonstrated CSF cell count 3, protein 30, glucose 79, negative myelin basic protein, negative gram stain, negative VDRL but did show mildly elevated IgG index 0.69 and over 5 bands in the CSF as well as serum.  She had a repeat MRI of the brain with and without contrast on 03/12/2021 which showed no acute changes and was stable compared to prior imaging from 01/25/2020.  Her prior neurologist felt that her weakness was related to deconditioning given her sedentary lifestyle.  She also reports arthritis in the knees.  Sjogren's antibodies and ANCA panel on 06/05/2021 were negative.  MRI of cervical and thoracic spine with and without contrast on 06/22/2021 personally reviewed showed degenerative changes with left paracentral disc protrusion at C5-6 causing mild flattening of left hemicord but spinal cord looks normal.  As she endorsed blurred vision, I had referred her to ophthalmology.  Found to have astigmatism in both eyes and mild glaucoma but otherwise  unremarkable.   She started having new headaches in June 2023.  They are a moderate-severe bifrontal pressure headache lasting 2-3 hours and occurring daily.  Associated with photophobia and sometimes phonophobia but no nausea, vomiting, visual disturbance, numbness or weakness.  Sometimes treats with ibuprofen  but not often and usually lays down to rest..  Denies prior history of headaches.  She had labs performed by another provider.  ESR and CRP were 59 and 14.8.  GCA not suspected as semiology not consistent.     Also has had tremors in the hands off and on for years which returned a couple of months ago as well.    Imaging: 07/02/2023 MRI BRAIN W WO:  Stable foci of FLAIR signal abnormality in the supratentorial brain consistent with the provided history of multiple sclerosis. No new or enhancing lesions to suggest active demyelination. 07/19/2022 MRI BRAIN W WO:  Stable examination since 03/12/2021. Cerebral hemispheric white matter lesions consistent with the clinical diagnosis of multiple sclerosis. No new or progressive lesions. No lesions show restricted diffusion or contrast enhancement. 06/22/2021 MRI C-SPINE W WO:  1. Normal MRI appearance of the cervical spinal cord. No cord signal changes to suggest demyelinating disease. No abnormal enhancement. 2. Left paracentral disc protrusion at C5-6 with mild flattening of the left hemi cord. 3. Right eccentric disc osteophyte complex at C6-7 with resultant mild spinal stenosis with moderate right and C7 foraminal narrowing.  06/22/2021 MRI T-SPINE W WO:  1. Normal MRI appearance of the thoracic spinal cord. No cord signal changes to suggest demyelinating disease. No abnormal enhancement. 2. Mild multilevel thoracic spondylosis with small disc protrusions at T2-3 through T7-8 as above. No significant stenosis 03/12/2021 MRI BRAIN W WO:  Abnormal MRI scan of the brain showing scattered mostly supratentorial periventricular, subcortical and pericallosal white matter hyperintensities in the pattern and distribution which may be compatible with demyelinating disease. No enhancing lesions are noted. Overall no significant change compared with previous MRI from 01/25/2020.  01/25/2020 MRI BRAIN WO:  - Multiple round and ovoid periventricular, subcortical,  pericallosal T2 hyperintensities. Considerations include demyelinating, autoimmune, inflammatory, post-infectious or  microvascular ischemic etiologies. - No acute findings.  PAST MEDICAL HISTORY: Past Medical History:  Diagnosis Date   Bipolar depression (HCC)    Chronic nausea    Dizziness    Fibula fracture    left   GERD (gastroesophageal reflux disease)    Hypertension    Hypothyroidism    Insomnia    Obesity    OSA on CPAP    RLS (restless legs syndrome)     MEDICATIONS: Current Outpatient Medications on File Prior to Visit  Medication Sig Dispense Refill   benazepril (LOTENSIN) 20 MG tablet Take 20 mg by mouth daily.     diazepam (VALIUM) 5 MG tablet Take 1 tablet 30-40 minutes prior to MRI 2 tablet 0   famotidine (PEPCID) 20 MG tablet Take 20 mg by mouth 2 (two) times daily.     gabapentin (NEURONTIN) 300 MG capsule Take 300 mg by mouth 2 (two) times daily.     hydrochlorothiazide (MICROZIDE) 12.5 MG capsule Take 1 capsule by mouth 3 (three) times daily.     hydrOXYzine (VISTARIL) 50 MG capsule Take 50 mg by mouth in the morning, at noon, and at bedtime.      levothyroxine (SYNTHROID) 150 MCG tablet Take 150 mcg by mouth daily before breakfast.     lithium carbonate 300 MG capsule Take 300 mg by mouth 2 (two) times  daily.     loratadine (CLARITIN) 10 MG tablet Take 10 mg by mouth daily.     ondansetron (ZOFRAN) 8 MG tablet Take 1 tablet (8 mg total) by mouth every 8 (eight) hours as needed for nausea or vomiting. 30 tablet 5   pravastatin (PRAVACHOL) 10 MG tablet Take 10 mg by mouth daily.     promethazine (PHENERGAN) 25 MG tablet Take 25 mg by mouth every 8 (eight) hours as needed for nausea/vomiting.     propranolol (INDERAL) 10 MG tablet TAKE 3 TABLETS BY MOUTH THREE TIMES DAILY 270 tablet 6   Semaglutide 14 MG TABS Take 1 tablet by mouth daily.     Teriflunomide 14 MG TABS Take 1 tablet (14 mg total) by mouth daily. 30 tablet 5   traZODone (DESYREL) 100 MG tablet Take  200 mg by mouth at bedtime.     venlafaxine XR (EFFEXOR-XR) 150 MG 24 hr capsule Take 150 mg by mouth daily.     No current facility-administered medications on file prior to visit.    ALLERGIES: Allergies  Allergen Reactions   Penicillins Rash    Has patient had a PCN reaction causing immediate rash, facial/tongue/throat swelling, SOB or lightheadedness with hypotension: Yes Has patient had a PCN reaction causing severe rash involving mucus membranes or skin necrosis: Unknown Has patient had a PCN reaction that required hospitalization: No Has patient had a PCN reaction occurring within the last 10 years: No If all of the above answers are "NO", then may proceed with Cephalosporin use.     FAMILY HISTORY: Family History  Adopted: Yes  Problem Relation Age of Onset   Multiple sclerosis Mother    Other Father        died at young age from a truck accident      Objective:  Blood pressure (!) 94/58, pulse 90, height 5\' 5"  (1.651 m), weight 255 lb 9.6 oz (115.9 kg), SpO2 97%.  General: No acute distress.  Patient appears well-groomed.   Head:  Normocephalic/atraumatic Neck:  Supple.  No paraspinal tenderness.  Full range of motion. Heart:  Regular rate and rhythm. Neuro:  Alert and oriented.  Speech fluent and not dysarthric.  Language intact.  CN II-XII intact.  Bulk and tone normal.  Muscle strength 5/5 throughout.  Sensation to pinprick and vibration intact.  Deep tendon reflexes 2+ throughout, toes downgoing.  Mildly wide-based and cautious gait.  Ambulates with cane.  Romberg negative.    Shon Millet, DO  CC: Peri Maris, FNP

## 2024-01-05 ENCOUNTER — Encounter: Payer: Self-pay | Admitting: Neurology

## 2024-01-05 ENCOUNTER — Ambulatory Visit (INDEPENDENT_AMBULATORY_CARE_PROVIDER_SITE_OTHER): Payer: Medicaid Other | Admitting: Neurology

## 2024-01-05 ENCOUNTER — Other Ambulatory Visit: Payer: BLUE CROSS/BLUE SHIELD

## 2024-01-05 VITALS — BP 94/58 | HR 90 | Ht 65.0 in | Wt 255.6 lb

## 2024-01-05 DIAGNOSIS — G250 Essential tremor: Secondary | ICD-10-CM | POA: Diagnosis not present

## 2024-01-05 DIAGNOSIS — G43009 Migraine without aura, not intractable, without status migrainosus: Secondary | ICD-10-CM

## 2024-01-05 DIAGNOSIS — G35 Multiple sclerosis: Secondary | ICD-10-CM

## 2024-01-05 NOTE — Patient Instructions (Addendum)
 Continue teriflunomide 14mg  daily Continue propranolol 30mg  three times daily Continue ondansetron for nausea Check labs today CBC with diff, hepatic panel Check MRI of brain with and without contrast in 6 months If they try to schedule you before 8/25 do not schedule  Follow up in 6 months (after MRI)

## 2024-01-06 LAB — CBC WITH DIFFERENTIAL/PLATELET
Absolute Lymphocytes: 1361 {cells}/uL (ref 850–3900)
Absolute Monocytes: 747 {cells}/uL (ref 200–950)
Basophils Absolute: 66 {cells}/uL (ref 0–200)
Basophils Relative: 0.8 %
Eosinophils Absolute: 249 {cells}/uL (ref 15–500)
Eosinophils Relative: 3 %
HCT: 42.6 % (ref 35.0–45.0)
Hemoglobin: 13.7 g/dL (ref 11.7–15.5)
MCH: 27.6 pg (ref 27.0–33.0)
MCHC: 32.2 g/dL (ref 32.0–36.0)
MCV: 85.9 fL (ref 80.0–100.0)
MPV: 9.9 fL (ref 7.5–12.5)
Monocytes Relative: 9 %
Neutro Abs: 5876 {cells}/uL (ref 1500–7800)
Neutrophils Relative %: 70.8 %
Platelets: 285 10*3/uL (ref 140–400)
RBC: 4.96 10*6/uL (ref 3.80–5.10)
RDW: 13.1 % (ref 11.0–15.0)
Total Lymphocyte: 16.4 %
WBC: 8.3 10*3/uL (ref 3.8–10.8)

## 2024-01-06 LAB — HEPATIC FUNCTION PANEL
AG Ratio: 1.3 (calc) (ref 1.0–2.5)
ALT: 8 U/L (ref 6–29)
AST: 12 U/L (ref 10–35)
Albumin: 4.3 g/dL (ref 3.6–5.1)
Alkaline phosphatase (APISO): 72 U/L (ref 37–153)
Bilirubin, Direct: 0.1 mg/dL (ref 0.0–0.2)
Globulin: 3.2 g/dL (ref 1.9–3.7)
Indirect Bilirubin: 0.4 mg/dL (ref 0.2–1.2)
Total Bilirubin: 0.5 mg/dL (ref 0.2–1.2)
Total Protein: 7.5 g/dL (ref 6.1–8.1)

## 2024-02-08 ENCOUNTER — Encounter: Payer: Self-pay | Admitting: Neurology

## 2024-03-03 ENCOUNTER — Telehealth: Payer: Self-pay | Admitting: Neurology

## 2024-03-03 NOTE — Telephone Encounter (Signed)
 Per patient she needs her Aubagio  sent to [facility]. Due to family death and no insurance she will needs help getting her medication.   Per patient [facility] can help her get a 90 day supply for cheaper.     Forms filled out and on Dr.Jaffe desk.

## 2024-03-03 NOTE — Telephone Encounter (Signed)
 Pt called in and left a message. She stated she needs an email address to get help with her prescription. They need a new prescription.

## 2024-03-04 NOTE — Telephone Encounter (Signed)
 Forms filled out and faxed.

## 2024-05-12 ENCOUNTER — Other Ambulatory Visit: Payer: Self-pay

## 2024-05-12 MED ORDER — PROPRANOLOL HCL 10 MG PO TABS: 30.0000 mg | ORAL_TABLET | Freq: Three times a day (TID) | ORAL | 6 refills | Status: DC

## 2024-05-26 ENCOUNTER — Encounter: Payer: Self-pay | Admitting: Neurology

## 2024-06-20 ENCOUNTER — Other Ambulatory Visit: Payer: Self-pay | Admitting: Neurology

## 2024-06-20 ENCOUNTER — Telehealth: Payer: Self-pay | Admitting: Neurology

## 2024-06-20 MED ORDER — DIAZEPAM 5 MG PO TABS: ORAL_TABLET | ORAL | 0 refills | Status: AC

## 2024-06-20 NOTE — Telephone Encounter (Signed)
 Pt called in this afternoon, and she is having an MRI done this Thursday 06-23-24. Pt would like to have some medicine so she can be calm and do the MRI. Thanks

## 2024-06-20 NOTE — Telephone Encounter (Signed)
 LMOVM per Dr.jaffe, Valium  sent to [facility] - needs driver to and from MRI

## 2024-06-23 ENCOUNTER — Ambulatory Visit
Admission: RE | Admit: 2024-06-23 | Discharge: 2024-06-23 | Disposition: A | Discharge status: Home / Self Care | Payer: BLUE CROSS/BLUE SHIELD | Source: Ambulatory Visit | Attending: Neurology | Admitting: Neurology

## 2024-06-23 DIAGNOSIS — G35 Multiple sclerosis: Secondary | ICD-10-CM

## 2024-06-23 MED ORDER — GADOPICLENOL 0.5 MMOL/ML IV SOLN
10.0000 mL | Freq: Once | INTRAVENOUS | Status: AC | PRN
  Administered 2024-06-23: 10 mL via INTRAVENOUS

## 2024-07-05 ENCOUNTER — Ambulatory Visit: Payer: BLUE CROSS/BLUE SHIELD | Admitting: Neurology

## 2024-07-06 ENCOUNTER — Ambulatory Visit: Payer: Self-pay | Admitting: Neurology

## 2024-07-06 NOTE — Progress Notes (Signed)
 Patient advised.

## 2024-07-12 NOTE — Progress Notes (Unsigned)
 NEUROLOGY FOLLOW UP OFFICE NOTE  Wanda Pratt [id number]  Assessment/Plan:   Multiple sclerosis Migraine without aura, without status migrainosus, not intractable  Essential tremor    DMT:   teriflunomide  14mg  daily Tremor/Migraines:  Propranolol  30mg  three times daily for tremor.  Given her history of Bipolar disorder, would avoid prescribing an antidepressant for migraine prevention; ondansetron  to 8mg  PRN Check labs today: CBC with diff, hepatic panel Follow up in 6 months (after repeat testing)      Subjective:  Wanda Pratt is a 58 year old right-handed female with HTN, hypothyroidism, prediabetes, OSA, RLS, chronic nausea and Bipolar depression who follows up for multiple sclerosis, tremor and migraines.  MRI brain personally reviewed.   UPDATE: DMT:  teriflunomide .  Current medications:  gabapentin  300mg  BID, propranolol  30mg  TID, ondansetron  8mg , hydroxyzine, lithium   06/23/2024 MRI BRAIN W WO:  Unchanged white matter disease in this patient with known multiple sclerosis. No new or enhancing lesions identified.  01/05/2024 LABS:  CBC with WBC 8.3, HGB 13.7 HCT 42.6, PLT 285, ALC 1361; hepatic panel with t bili 0.5, ALP 72, AST 12, ALT 8.   Tremor:  improved on propranolol  Headache:  rare on propranolol  Nausea - chronic.   Vertigo - episodes are occasional  Pain - dealing with left sciatica flare    HISTORY: She began experiencing dizziness and nausea around 2020.  Dizziness was described as a spinning or lightheadedness.  It would typically happen upon standing from a seated position and occur a couple of times a week.  In late January 2021, she was sitting in her recliner and when she got up, she felt dizzy and passed out, falling and sustaining a left distal fibula fracture.  She endorsed difficulty with balance and aching pain in the legs as well as paresthesias.  She also reports blurred vision, muscle spasms and cramps.  She also endorses memory problems  and fatigue.  NCV-EMG of the lower extremities on 02/22/2020 was normal.  Continued to have transient episodes of dizziness.  Labs at that time were unremarkable, including CBC, CMP, TSH, and vit D.  Echocardiogram was unremarkable with LVEF 60-65%.  She saw neurology.  Awake and asleep EEG on 01/16/2020 was normal.  Serum labs were unremarkable, including ANA, ESR, CRP, CK, TSH, B12, folic acid, Lyme, RPR, HIV, Lyme, vit D, CBC and CMP.  MRI of brain with and without contrast on 01/25/2020 showed multiple round and ovoid periventricular subcortical and pericallosal T2 hyperintensities, non-enhancing.  Her biological mother reportedly died of complications from multiple sclerosis when she was in her 79s.  She underwent lumbar puncture on 02/17/2020 which demonstrated CSF cell count 3, protein 30, glucose 79, negative myelin basic protein, negative gram stain, negative VDRL but did show mildly elevated IgG index 0.69 and over 5 bands in the CSF as well as serum.  She had a repeat MRI of the brain with and without contrast on 03/12/2021 which showed no acute changes and was stable compared to prior imaging from 01/25/2020.  Her prior neurologist felt that her weakness was related to deconditioning given her sedentary lifestyle.  She also reports arthritis in the knees.  Sjogren's antibodies and ANCA panel on 06/05/2021 were negative.  MRI of cervical and thoracic spine with and without contrast on 06/22/2021 personally reviewed showed degenerative changes with left paracentral disc protrusion at C5-6 causing mild flattening of left hemicord but spinal cord looks normal.  As she endorsed blurred vision, I had referred her to ophthalmology.  Found to have astigmatism in both eyes and mild glaucoma but otherwise unremarkable.   She started having new headaches in June 2023.  They are a moderate-severe bifrontal pressure headache lasting 2-3 hours and occurring daily.  Associated with photophobia and sometimes phonophobia but no  nausea, vomiting, visual disturbance, numbness or weakness.  Sometimes treats with ibuprofen  but not often and usually lays down to rest..  Denies prior history of headaches.  She had labs performed by another provider.  ESR and CRP were 59 and 14.8.  GCA not suspected as semiology not consistent.     Also has had tremors in the hands off and on for years which returned a couple of months ago as well.    Imaging: 07/02/2023 MRI BRAIN W WO:  Stable foci of FLAIR signal abnormality in the supratentorial brain consistent with the provided history of multiple sclerosis. No new or enhancing lesions to suggest active demyelination. 07/19/2022 MRI BRAIN W WO:  Stable examination since 03/12/2021. Cerebral hemispheric white matter lesions consistent with the clinical diagnosis of multiple sclerosis. No new or progressive lesions. No lesions show restricted diffusion or contrast enhancement. 06/22/2021 MRI C-SPINE W WO:  1. Normal MRI appearance of the cervical spinal cord. No cord signal changes to suggest demyelinating disease. No abnormal enhancement. 2. Left paracentral disc protrusion at C5-6 with mild flattening of the left hemi cord. 3. Right eccentric disc osteophyte complex at C6-7 with resultant mild spinal stenosis with moderate right and C7 foraminal narrowing.  06/22/2021 MRI T-SPINE W WO:  1. Normal MRI appearance of the thoracic spinal cord. No cord signal changes to suggest demyelinating disease. No abnormal enhancement. 2. Mild multilevel thoracic spondylosis with small disc protrusions at T2-3 through T7-8 as above. No significant stenosis 03/12/2021 MRI BRAIN W WO:  Abnormal MRI scan of the brain showing scattered mostly supratentorial periventricular, subcortical and pericallosal white matter hyperintensities in the pattern and distribution which may be compatible with demyelinating disease. No enhancing lesions are noted. Overall no significant change compared with previous MRI from 01/25/2020.   01/25/2020 MRI BRAIN WO:  - Multiple round and ovoid periventricular, subcortical, pericallosal T2 hyperintensities. Considerations include demyelinating, autoimmune, inflammatory, post-infectious or  microvascular ischemic etiologies. - No acute findings.  PAST MEDICAL HISTORY: Past Medical History:  Diagnosis Date   Bipolar depression (HCC)    Chronic nausea    Dizziness    Fibula fracture    left   GERD (gastroesophageal reflux disease)    Hypertension    Hypothyroidism    Insomnia    Obesity    OSA on CPAP    RLS (restless legs syndrome)     MEDICATIONS: Current Outpatient Medications on File Prior to Visit  Medication Sig Dispense Refill   benazepril  (LOTENSIN ) 20 MG tablet Take 20 mg by mouth daily.     diazepam  (VALIUM ) 5 MG tablet Take 1 tablet 30 to 40 minutes prior to MRI.  May repeat dose at MRI if needed. 2 tablet 0   famotidine (PEPCID) 20 MG tablet Take 20 mg by mouth 2 (two) times daily.     gabapentin  (NEURONTIN ) 300 MG capsule Take 300 mg by mouth 2 (two) times daily.     hydrochlorothiazide  (MICROZIDE ) 12.5 MG capsule Take 1 capsule by mouth 3 (three) times daily.     hydrOXYzine (VISTARIL) 50 MG capsule Take 50 mg by mouth in the morning, at noon, and at bedtime.      levothyroxine  (SYNTHROID ) 150 MCG tablet Take 150 mcg  by mouth daily before breakfast.     lithium  carbonate 300 MG capsule Take 300 mg by mouth 2 (two) times daily.     loratadine  (CLARITIN ) 10 MG tablet Take 10 mg by mouth daily.     ondansetron  (ZOFRAN ) 8 MG tablet Take 1 tablet (8 mg total) by mouth every 8 (eight) hours as needed for nausea or vomiting. 30 tablet 5   pravastatin (PRAVACHOL) 10 MG tablet Take 10 mg by mouth daily.     promethazine (PHENERGAN) 25 MG tablet Take 25 mg by mouth every 8 (eight) hours as needed for nausea/vomiting.     propranolol  (INDERAL ) 10 MG tablet Take 3 tablets (30 mg total) by mouth 3 (three) times daily. 270 tablet 6   Semaglutide 14 MG TABS Take 1 tablet  by mouth daily.     Teriflunomide  14 MG TABS Take 1 tablet (14 mg total) by mouth daily. 30 tablet 5   traZODone  (DESYREL ) 100 MG tablet Take 200 mg by mouth at bedtime.     venlafaxine XR (EFFEXOR-XR) 150 MG 24 hr capsule Take 150 mg by mouth daily.     No current facility-administered medications on file prior to visit.    ALLERGIES: Allergies  Allergen Reactions   Penicillins Rash    Has patient had a PCN reaction causing immediate rash, facial/tongue/throat swelling, SOB or lightheadedness with hypotension: Yes Has patient had a PCN reaction causing severe rash involving mucus membranes or skin necrosis: Unknown Has patient had a PCN reaction that required hospitalization: No Has patient had a PCN reaction occurring within the last 10 years: No If all of the above answers are NO, then may proceed with Cephalosporin use.     FAMILY HISTORY: Family History  Adopted: Yes  Problem Relation Age of Onset   Multiple sclerosis Mother    Other Father        died at young age from a truck accident      Objective:  Blood pressure 112/75, pulse 82, height 5' 6 (1.676 m), weight 286 lb (129.7 kg), SpO2 96%. General: No acute distress.  Patient appears well-groomed.   Head:  Normocephalic/atraumatic Neck:  Supple.  No paraspinal tenderness.  Full range of motion. Heart:  Regular rate and rhythm. Neuro:  Alert and oriented.  Speech fluent and not dysarthric.  Language intact.  CN II-XII intact.  Bulk and tone normal.  Muscle strength 5/5 throughout.  Mild postural and kinetic tremor in hands.  Sensation to pinprick reduced in left first toe and vibration intact.  Deep tendon reflexes 2+ throughout, toes downgoing.  Mildly wide-based and antalgic gait  Ambulates with cane.  Romberg negative.    Juliene Dunnings, DO  CC: Prentice Batch, FNP

## 2024-07-13 ENCOUNTER — Encounter: Payer: Self-pay | Admitting: Neurology

## 2024-07-13 ENCOUNTER — Ambulatory Visit (INDEPENDENT_AMBULATORY_CARE_PROVIDER_SITE_OTHER): Admitting: Neurology

## 2024-07-13 ENCOUNTER — Other Ambulatory Visit

## 2024-07-13 VITALS — BP 112/75 | HR 82 | Ht 66.0 in | Wt 286.0 lb

## 2024-07-13 DIAGNOSIS — G250 Essential tremor: Secondary | ICD-10-CM

## 2024-07-13 DIAGNOSIS — G35 Multiple sclerosis: Secondary | ICD-10-CM

## 2024-07-13 DIAGNOSIS — G43009 Migraine without aura, not intractable, without status migrainosus: Secondary | ICD-10-CM | POA: Diagnosis not present

## 2024-07-13 NOTE — Patient Instructions (Signed)
 Check labs:  CBC with diff, hepatic panel, vitamin D  Continue current medication for now Follow up 6 months.

## 2024-07-14 ENCOUNTER — Ambulatory Visit: Payer: Self-pay | Admitting: Neurology

## 2024-07-14 LAB — CBC WITH DIFFERENTIAL/PLATELET
Absolute Lymphocytes: 1421 {cells}/uL (ref 850–3900)
Absolute Monocytes: 378 {cells}/uL (ref 200–950)
Basophils Absolute: 51 {cells}/uL (ref 0–200)
Basophils Relative: 0.8 %
Eosinophils Absolute: 218 {cells}/uL (ref 15–500)
Eosinophils Relative: 3.4 %
HCT: 43.8 % (ref 35.0–45.0)
Hemoglobin: 14.2 g/dL (ref 11.7–15.5)
MCH: 28.6 pg (ref 27.0–33.0)
MCHC: 32.4 g/dL (ref 32.0–36.0)
MCV: 88.3 fL (ref 80.0–100.0)
MPV: 9.8 fL (ref 7.5–12.5)
Monocytes Relative: 5.9 %
Neutro Abs: 4333 {cells}/uL (ref 1500–7800)
Neutrophils Relative %: 67.7 %
Platelets: 255 Thousand/uL (ref 140–400)
RBC: 4.96 Million/uL (ref 3.80–5.10)
RDW: 12.6 % (ref 11.0–15.0)
Total Lymphocyte: 22.2 %
WBC: 6.4 Thousand/uL (ref 3.8–10.8)

## 2024-07-14 LAB — HEPATIC FUNCTION PANEL
AG Ratio: 1.2 (calc) (ref 1.0–2.5)
ALT: 20 U/L (ref 6–29)
AST: 19 U/L (ref 10–35)
Albumin: 4.3 g/dL (ref 3.6–5.1)
Alkaline phosphatase (APISO): 71 U/L (ref 37–153)
Bilirubin, Direct: 0.1 mg/dL (ref 0.0–0.2)
Globulin: 3.7 g/dL (ref 1.9–3.7)
Indirect Bilirubin: 0.4 mg/dL (ref 0.2–1.2)
Total Bilirubin: 0.5 mg/dL (ref 0.2–1.2)
Total Protein: 8 g/dL (ref 6.1–8.1)

## 2024-07-14 LAB — VITAMIN D 25 HYDROXY (VIT D DEFICIENCY, FRACTURES): Vit D, 25-Hydroxy: 49 ng/mL (ref 30–100)

## 2024-07-25 ENCOUNTER — Other Ambulatory Visit: Payer: Self-pay | Admitting: Neurology

## 2024-08-22 ENCOUNTER — Other Ambulatory Visit: Payer: Self-pay | Admitting: Neurology

## 2024-08-24 DIAGNOSIS — Z6841 Body Mass Index (BMI) 40.0 and over, adult: Secondary | ICD-10-CM | POA: Diagnosis not present

## 2024-08-24 DIAGNOSIS — I10 Essential (primary) hypertension: Secondary | ICD-10-CM | POA: Diagnosis not present

## 2024-08-24 DIAGNOSIS — R110 Nausea: Secondary | ICD-10-CM | POA: Diagnosis not present

## 2024-08-24 DIAGNOSIS — R059 Cough, unspecified: Secondary | ICD-10-CM | POA: Diagnosis not present

## 2024-08-24 DIAGNOSIS — E1169 Type 2 diabetes mellitus with other specified complication: Secondary | ICD-10-CM | POA: Diagnosis not present

## 2024-08-24 DIAGNOSIS — K580 Irritable bowel syndrome with diarrhea: Secondary | ICD-10-CM | POA: Diagnosis not present

## 2024-08-24 DIAGNOSIS — M5432 Sciatica, left side: Secondary | ICD-10-CM | POA: Diagnosis not present

## 2024-09-19 ENCOUNTER — Other Ambulatory Visit: Payer: Self-pay | Admitting: Family Medicine

## 2024-09-19 DIAGNOSIS — Z1231 Encounter for screening mammogram for malignant neoplasm of breast: Secondary | ICD-10-CM

## 2024-09-21 ENCOUNTER — Other Ambulatory Visit: Payer: Self-pay | Admitting: Neurology

## 2024-10-27 ENCOUNTER — Inpatient Hospital Stay: Admission: RE | Admit: 2024-10-27 | Discharge: 2024-10-27 | Attending: Family Medicine | Admitting: Family Medicine

## 2024-10-27 DIAGNOSIS — Z1231 Encounter for screening mammogram for malignant neoplasm of breast: Secondary | ICD-10-CM

## 2025-01-17 ENCOUNTER — Ambulatory Visit: Admitting: Neurology
